# Patient Record
Sex: Female | Born: 1980
Health system: Southern US, Community
[De-identification: ages and names within clinical notes are randomized; demographics above are authoritative.]

## PROBLEM LIST (undated history)

## (undated) DIAGNOSIS — L509 Urticaria, unspecified: Secondary | ICD-10-CM

## (undated) DIAGNOSIS — E109 Type 1 diabetes mellitus without complications: Secondary | ICD-10-CM

## (undated) DIAGNOSIS — K580 Irritable bowel syndrome with diarrhea: Secondary | ICD-10-CM

## (undated) HISTORY — DX: Irritable bowel syndrome with diarrhea: K58.0

## (undated) HISTORY — PX: NO PAST SURGERIES: SHX2092

## (undated) HISTORY — DX: Type 1 diabetes mellitus without complications: E10.9

## (undated) HISTORY — DX: Urticaria, unspecified: L50.9

---

## 1999-08-02 ENCOUNTER — Other Ambulatory Visit: Admission: RE | Admit: 1999-08-02 | Discharge: 1999-08-02 | Payer: Self-pay

## 1999-08-02 ENCOUNTER — Encounter (INDEPENDENT_AMBULATORY_CARE_PROVIDER_SITE_OTHER): Payer: Self-pay | Admitting: Specialist

## 2000-11-23 ENCOUNTER — Encounter: Payer: Self-pay | Admitting: Emergency Medicine

## 2000-11-23 ENCOUNTER — Emergency Department (HOSPITAL_COMMUNITY): Admission: EM | Admit: 2000-11-23 | Discharge: 2000-11-23 | Payer: Self-pay | Admitting: Emergency Medicine

## 2001-12-29 ENCOUNTER — Emergency Department (HOSPITAL_COMMUNITY): Admission: EM | Admit: 2001-12-29 | Discharge: 2001-12-29 | Payer: Self-pay | Admitting: *Deleted

## 2002-10-22 ENCOUNTER — Other Ambulatory Visit: Admission: RE | Admit: 2002-10-22 | Discharge: 2002-10-22 | Payer: Self-pay | Admitting: Gynecology

## 2003-10-26 ENCOUNTER — Other Ambulatory Visit: Admission: RE | Admit: 2003-10-26 | Discharge: 2003-10-26 | Payer: Self-pay | Admitting: Gynecology

## 2004-11-04 ENCOUNTER — Other Ambulatory Visit: Admission: RE | Admit: 2004-11-04 | Discharge: 2004-11-04 | Payer: Self-pay | Admitting: Gynecology

## 2004-11-11 ENCOUNTER — Ambulatory Visit: Payer: Self-pay | Admitting: Family Medicine

## 2005-11-09 ENCOUNTER — Other Ambulatory Visit: Admission: RE | Admit: 2005-11-09 | Discharge: 2005-11-09 | Payer: Self-pay | Admitting: Gynecology

## 2006-11-14 ENCOUNTER — Other Ambulatory Visit: Admission: RE | Admit: 2006-11-14 | Discharge: 2006-11-14 | Payer: Self-pay | Admitting: Gynecology

## 2007-12-12 ENCOUNTER — Other Ambulatory Visit: Admission: RE | Admit: 2007-12-12 | Discharge: 2007-12-12 | Payer: Self-pay | Admitting: Gynecology

## 2008-12-30 ENCOUNTER — Encounter: Payer: Self-pay | Admitting: Women's Health

## 2008-12-30 ENCOUNTER — Ambulatory Visit: Payer: Self-pay | Admitting: Women's Health

## 2008-12-30 ENCOUNTER — Other Ambulatory Visit: Admission: RE | Admit: 2008-12-30 | Discharge: 2008-12-30 | Payer: Self-pay | Admitting: Gynecology

## 2015-11-24 DIAGNOSIS — Z794 Long term (current) use of insulin: Secondary | ICD-10-CM | POA: Insufficient documentation

## 2015-11-24 DIAGNOSIS — E109 Type 1 diabetes mellitus without complications: Secondary | ICD-10-CM | POA: Insufficient documentation

## 2017-10-19 DIAGNOSIS — E109 Type 1 diabetes mellitus without complications: Secondary | ICD-10-CM | POA: Diagnosis not present

## 2017-11-23 DIAGNOSIS — E109 Type 1 diabetes mellitus without complications: Secondary | ICD-10-CM | POA: Diagnosis not present

## 2017-12-18 DIAGNOSIS — E109 Type 1 diabetes mellitus without complications: Secondary | ICD-10-CM | POA: Diagnosis not present

## 2017-12-21 DIAGNOSIS — E109 Type 1 diabetes mellitus without complications: Secondary | ICD-10-CM | POA: Diagnosis not present

## 2017-12-21 DIAGNOSIS — Z794 Long term (current) use of insulin: Secondary | ICD-10-CM | POA: Diagnosis not present

## 2018-01-31 DIAGNOSIS — E109 Type 1 diabetes mellitus without complications: Secondary | ICD-10-CM | POA: Diagnosis not present

## 2018-02-28 DIAGNOSIS — E109 Type 1 diabetes mellitus without complications: Secondary | ICD-10-CM | POA: Diagnosis not present

## 2018-03-28 DIAGNOSIS — E109 Type 1 diabetes mellitus without complications: Secondary | ICD-10-CM | POA: Diagnosis not present

## 2018-04-23 DIAGNOSIS — Z9641 Presence of insulin pump (external) (internal): Secondary | ICD-10-CM | POA: Diagnosis not present

## 2018-04-23 DIAGNOSIS — E109 Type 1 diabetes mellitus without complications: Secondary | ICD-10-CM | POA: Diagnosis not present

## 2018-04-29 DIAGNOSIS — E109 Type 1 diabetes mellitus without complications: Secondary | ICD-10-CM | POA: Diagnosis not present

## 2018-05-29 DIAGNOSIS — E109 Type 1 diabetes mellitus without complications: Secondary | ICD-10-CM | POA: Diagnosis not present

## 2018-06-29 DIAGNOSIS — E109 Type 1 diabetes mellitus without complications: Secondary | ICD-10-CM | POA: Diagnosis not present

## 2018-07-29 DIAGNOSIS — Z01419 Encounter for gynecological examination (general) (routine) without abnormal findings: Secondary | ICD-10-CM | POA: Diagnosis not present

## 2018-07-29 DIAGNOSIS — E109 Type 1 diabetes mellitus without complications: Secondary | ICD-10-CM | POA: Diagnosis not present

## 2018-07-29 DIAGNOSIS — Z0000 Encounter for general adult medical examination without abnormal findings: Secondary | ICD-10-CM | POA: Diagnosis not present

## 2018-07-29 DIAGNOSIS — Z6823 Body mass index (BMI) 23.0-23.9, adult: Secondary | ICD-10-CM | POA: Diagnosis not present

## 2018-07-29 LAB — HM PAP SMEAR: HM Pap smear: NORMAL

## 2018-08-02 DIAGNOSIS — M25552 Pain in left hip: Secondary | ICD-10-CM | POA: Diagnosis not present

## 2018-08-02 DIAGNOSIS — M25551 Pain in right hip: Secondary | ICD-10-CM | POA: Diagnosis not present

## 2018-08-09 DIAGNOSIS — T7840XA Allergy, unspecified, initial encounter: Secondary | ICD-10-CM | POA: Diagnosis not present

## 2018-08-09 DIAGNOSIS — L2389 Allergic contact dermatitis due to other agents: Secondary | ICD-10-CM | POA: Diagnosis not present

## 2018-08-16 DIAGNOSIS — R531 Weakness: Secondary | ICD-10-CM | POA: Diagnosis not present

## 2018-08-16 DIAGNOSIS — M25651 Stiffness of right hip, not elsewhere classified: Secondary | ICD-10-CM | POA: Diagnosis not present

## 2018-08-16 DIAGNOSIS — M25551 Pain in right hip: Secondary | ICD-10-CM | POA: Diagnosis not present

## 2018-08-16 DIAGNOSIS — M25552 Pain in left hip: Secondary | ICD-10-CM | POA: Diagnosis not present

## 2018-08-20 DIAGNOSIS — M25551 Pain in right hip: Secondary | ICD-10-CM | POA: Diagnosis not present

## 2018-08-20 DIAGNOSIS — R531 Weakness: Secondary | ICD-10-CM | POA: Diagnosis not present

## 2018-08-20 DIAGNOSIS — M25552 Pain in left hip: Secondary | ICD-10-CM | POA: Diagnosis not present

## 2018-08-20 DIAGNOSIS — M25651 Stiffness of right hip, not elsewhere classified: Secondary | ICD-10-CM | POA: Diagnosis not present

## 2018-08-22 DIAGNOSIS — M25552 Pain in left hip: Secondary | ICD-10-CM | POA: Diagnosis not present

## 2018-08-22 DIAGNOSIS — R531 Weakness: Secondary | ICD-10-CM | POA: Diagnosis not present

## 2018-08-22 DIAGNOSIS — M25551 Pain in right hip: Secondary | ICD-10-CM | POA: Diagnosis not present

## 2018-08-22 DIAGNOSIS — M25651 Stiffness of right hip, not elsewhere classified: Secondary | ICD-10-CM | POA: Diagnosis not present

## 2018-08-27 DIAGNOSIS — M25651 Stiffness of right hip, not elsewhere classified: Secondary | ICD-10-CM | POA: Diagnosis not present

## 2018-08-27 DIAGNOSIS — M25552 Pain in left hip: Secondary | ICD-10-CM | POA: Diagnosis not present

## 2018-08-27 DIAGNOSIS — M25551 Pain in right hip: Secondary | ICD-10-CM | POA: Diagnosis not present

## 2018-08-27 DIAGNOSIS — R531 Weakness: Secondary | ICD-10-CM | POA: Diagnosis not present

## 2018-08-28 DIAGNOSIS — E109 Type 1 diabetes mellitus without complications: Secondary | ICD-10-CM | POA: Diagnosis not present

## 2018-08-29 DIAGNOSIS — R531 Weakness: Secondary | ICD-10-CM | POA: Diagnosis not present

## 2018-08-29 DIAGNOSIS — M25651 Stiffness of right hip, not elsewhere classified: Secondary | ICD-10-CM | POA: Diagnosis not present

## 2018-08-29 DIAGNOSIS — M25551 Pain in right hip: Secondary | ICD-10-CM | POA: Diagnosis not present

## 2018-08-29 DIAGNOSIS — M25552 Pain in left hip: Secondary | ICD-10-CM | POA: Diagnosis not present

## 2018-08-30 DIAGNOSIS — E109 Type 1 diabetes mellitus without complications: Secondary | ICD-10-CM | POA: Diagnosis not present

## 2018-09-04 DIAGNOSIS — M25651 Stiffness of right hip, not elsewhere classified: Secondary | ICD-10-CM | POA: Diagnosis not present

## 2018-09-04 DIAGNOSIS — M25551 Pain in right hip: Secondary | ICD-10-CM | POA: Diagnosis not present

## 2018-09-04 DIAGNOSIS — M25552 Pain in left hip: Secondary | ICD-10-CM | POA: Diagnosis not present

## 2018-09-04 DIAGNOSIS — R531 Weakness: Secondary | ICD-10-CM | POA: Diagnosis not present

## 2018-09-11 DIAGNOSIS — M25651 Stiffness of right hip, not elsewhere classified: Secondary | ICD-10-CM | POA: Diagnosis not present

## 2018-09-11 DIAGNOSIS — M25551 Pain in right hip: Secondary | ICD-10-CM | POA: Diagnosis not present

## 2018-09-11 DIAGNOSIS — R531 Weakness: Secondary | ICD-10-CM | POA: Diagnosis not present

## 2018-09-11 DIAGNOSIS — M25552 Pain in left hip: Secondary | ICD-10-CM | POA: Diagnosis not present

## 2018-09-18 DIAGNOSIS — M25551 Pain in right hip: Secondary | ICD-10-CM | POA: Diagnosis not present

## 2018-09-18 DIAGNOSIS — R531 Weakness: Secondary | ICD-10-CM | POA: Diagnosis not present

## 2018-09-18 DIAGNOSIS — M25552 Pain in left hip: Secondary | ICD-10-CM | POA: Diagnosis not present

## 2018-09-18 DIAGNOSIS — M25651 Stiffness of right hip, not elsewhere classified: Secondary | ICD-10-CM | POA: Diagnosis not present

## 2018-09-25 DIAGNOSIS — M25651 Stiffness of right hip, not elsewhere classified: Secondary | ICD-10-CM | POA: Diagnosis not present

## 2018-09-25 DIAGNOSIS — M25551 Pain in right hip: Secondary | ICD-10-CM | POA: Diagnosis not present

## 2018-09-25 DIAGNOSIS — M25552 Pain in left hip: Secondary | ICD-10-CM | POA: Diagnosis not present

## 2018-09-25 DIAGNOSIS — R531 Weakness: Secondary | ICD-10-CM | POA: Diagnosis not present

## 2018-09-28 DIAGNOSIS — E109 Type 1 diabetes mellitus without complications: Secondary | ICD-10-CM | POA: Diagnosis not present

## 2018-10-04 DIAGNOSIS — M25651 Stiffness of right hip, not elsewhere classified: Secondary | ICD-10-CM | POA: Diagnosis not present

## 2018-10-04 DIAGNOSIS — M25552 Pain in left hip: Secondary | ICD-10-CM | POA: Diagnosis not present

## 2018-10-04 DIAGNOSIS — R531 Weakness: Secondary | ICD-10-CM | POA: Diagnosis not present

## 2018-10-04 DIAGNOSIS — M25551 Pain in right hip: Secondary | ICD-10-CM | POA: Diagnosis not present

## 2018-11-05 DIAGNOSIS — E109 Type 1 diabetes mellitus without complications: Secondary | ICD-10-CM | POA: Diagnosis not present

## 2018-12-02 DIAGNOSIS — E109 Type 1 diabetes mellitus without complications: Secondary | ICD-10-CM | POA: Diagnosis not present

## 2018-12-26 DIAGNOSIS — E109 Type 1 diabetes mellitus without complications: Secondary | ICD-10-CM | POA: Diagnosis not present

## 2018-12-31 DIAGNOSIS — Z9641 Presence of insulin pump (external) (internal): Secondary | ICD-10-CM | POA: Diagnosis not present

## 2018-12-31 DIAGNOSIS — E109 Type 1 diabetes mellitus without complications: Secondary | ICD-10-CM | POA: Diagnosis not present

## 2019-02-07 LAB — HM DIABETES EYE EXAM

## 2019-03-27 DIAGNOSIS — E109 Type 1 diabetes mellitus without complications: Secondary | ICD-10-CM | POA: Diagnosis not present

## 2019-05-02 DIAGNOSIS — E109 Type 1 diabetes mellitus without complications: Secondary | ICD-10-CM | POA: Diagnosis not present

## 2019-05-02 DIAGNOSIS — Z794 Long term (current) use of insulin: Secondary | ICD-10-CM | POA: Diagnosis not present

## 2019-05-07 DIAGNOSIS — E109 Type 1 diabetes mellitus without complications: Secondary | ICD-10-CM | POA: Diagnosis not present

## 2019-06-27 DIAGNOSIS — E109 Type 1 diabetes mellitus without complications: Secondary | ICD-10-CM | POA: Diagnosis not present

## 2019-08-26 DIAGNOSIS — Z6823 Body mass index (BMI) 23.0-23.9, adult: Secondary | ICD-10-CM | POA: Diagnosis not present

## 2019-08-26 DIAGNOSIS — Z23 Encounter for immunization: Secondary | ICD-10-CM | POA: Diagnosis not present

## 2019-08-26 DIAGNOSIS — Z0000 Encounter for general adult medical examination without abnormal findings: Secondary | ICD-10-CM | POA: Diagnosis not present

## 2019-08-29 DIAGNOSIS — E109 Type 1 diabetes mellitus without complications: Secondary | ICD-10-CM | POA: Diagnosis not present

## 2019-08-29 DIAGNOSIS — Z794 Long term (current) use of insulin: Secondary | ICD-10-CM | POA: Diagnosis not present

## 2019-09-26 DIAGNOSIS — E109 Type 1 diabetes mellitus without complications: Secondary | ICD-10-CM | POA: Diagnosis not present

## 2019-12-16 DIAGNOSIS — E109 Type 1 diabetes mellitus without complications: Secondary | ICD-10-CM | POA: Diagnosis not present

## 2019-12-30 ENCOUNTER — Ambulatory Visit: Payer: Self-pay | Admitting: Nurse Practitioner

## 2019-12-30 ENCOUNTER — Other Ambulatory Visit: Payer: Self-pay

## 2019-12-30 ENCOUNTER — Encounter: Payer: Self-pay | Admitting: Nurse Practitioner

## 2019-12-30 ENCOUNTER — Ambulatory Visit (INDEPENDENT_AMBULATORY_CARE_PROVIDER_SITE_OTHER): Payer: BC Managed Care – PPO | Admitting: Nurse Practitioner

## 2019-12-30 VITALS — BP 102/70 | HR 73 | Temp 98.4°F | Resp 17 | Ht 67.0 in | Wt 154.2 lb

## 2019-12-30 DIAGNOSIS — L509 Urticaria, unspecified: Secondary | ICD-10-CM

## 2019-12-30 DIAGNOSIS — Z794 Long term (current) use of insulin: Secondary | ICD-10-CM

## 2019-12-30 DIAGNOSIS — L508 Other urticaria: Secondary | ICD-10-CM | POA: Insufficient documentation

## 2019-12-30 MED ORDER — PREDNISONE 10 MG PO TABS: ORAL_TABLET | ORAL | 0 refills | Status: DC

## 2019-12-30 MED ORDER — TRIAMCINOLONE ACETONIDE 40 MG/ML IJ SUSP
60.0000 mg | Freq: Once | INTRAMUSCULAR | Status: AC
  Administered 2019-12-30: 60 mg via INTRAMUSCULAR

## 2019-12-30 NOTE — Patient Instructions (Addendum)
Hives Hives are itchy, red, swollen areas on your skin. Hives can show up on any part of your body. Hives often fade within 24 hours (acute hives). New hives can show up after old ones fade. This can go on for many days or weeks (chronic hives). Hives do not spread from person to person (are not contagious). Hives are caused by your body's response to something that you are allergic to (allergen). These are sometimes called triggers. You can get hives right after being around a trigger, or hours later. What are the causes?  Allergies to foods.  Insect bites or stings.  Pollen.  Pets.  Latex.  Chemicals.  Spending time in sunlight, heat, or cold.  Exercise.  Stress.  Some medicines.  Viruses. This includes the common cold.  Infections caused by germs (bacteria).  Allergy shots.  Blood transfusions. Sometimes, the cause is not known. What increases the risk?  Being a woman.  Being allergic to foods such as: ? Citrus fruits. ? Milk. ? Eggs. ? Peanuts. ? Tree nuts. ? Shellfish.  Being allergic to: ? Medicines. ? Latex. ? Insects. ? Animals. ? Pollen. What are the signs or symptoms?   Raised, itchy, red or white bumps or patches on your skin. These areas may: ? Get large and swollen. ? Change in shape and location. ? Stand alone or connect to each other over a large area of skin. ? Sting or hurt. ? Turn white when pressed in the center (blanch). In very bad cases, your hands, feet, and face may also get swollen. This may happen if hives start deeper in your skin. How is this treated? Treatment for this condition depends on your symptoms. Treatment may include:  Using cool, wet cloths (cool compresses) or taking cool showers to stop the itching.  Medicines that help: ? Relieve itching (antihistamines). ? Reduce swelling (corticosteroids). ? Treat infection (antibiotics).  A medicine (omalizumab) that is given as a shot (injection). Your doctor may  prescribe this if you have hives that do not get better even after other treatments.  In very bad cases, you may need a shot of a medicine called epinephrineto prevent a life-threatening allergic reaction (anaphylaxis). Follow these instructions at home: Medicines  Take or apply over-the-counter and prescription medicines only as told by your doctor.  If you were prescribed an antibiotic medicine, use it as told by your doctor. Do not stop using it even if you start to feel better. Skin care  Apply cool, wet cloths to the hives.  Do not scratch your skin. Do not rub your skin. General instructions  Do not take hot showers or baths. This can make itching worse.  Do not wear tight clothes.  Use sunscreen and wear clothes that cover your skin when you are outside.  Avoid any triggers that cause your hives. Keep a journal to help track what causes your hives. Write down: ? What medicines you take. ? What you eat and drink. ? What products you use on your skin.  Keep all follow-up visits as told by your doctor. This is important. Contact a doctor if:  Your symptoms are not better with medicine.  Your joints hurt or are swollen. Get help right away if:  You have a fever.  You have pain in your belly (abdomen).  Your tongue or lips are swollen.  Your eyelids are swollen.  Your chest or throat feels tight.  You have trouble breathing or swallowing. These symptoms may be an emergency.   Do not wait to see if the symptoms will go away. Get medical help right away. Call your local emergency services (911 in the [place] Do not drive yourself to the hospital. Summary  Hives are itchy, red, swollen areas on your skin.  Treatment for this condition depends on your symptoms.  Avoid things that cause your hives. Keep a journal to help track what causes your hives.  Take and apply over-the-counter and prescription medicines only as told by your doctor.  Keep all follow-up visits  as told by your doctor. This is important. This information is not intended to replace advice given to you by your health care provider. Make sure you discuss any questions you have with your health care provider. Document Revised: 04/10/2018 Document Reviewed: 04/10/2018 Elsevier Patient Education  2020 [employer] Inc.  

## 2019-12-30 NOTE — Assessment & Plan Note (Addendum)
Not well controlled.  Steroid shot given in office, Rx sent to pharmacy, Asthma and Allergy clinic referral done. Patient knows to follow up as needed. Marland Kitchen

## 2019-12-30 NOTE — Assessment & Plan Note (Signed)
Well controlled.  No changes to medicines.  Continue to work on eating a healthy diet and exercise.  No Labs drawn today.  

## 2019-12-30 NOTE — Progress Notes (Signed)
Acute Office Visit  Subjective:    Patient ID: Susan Chapman, female    DOB: January 31, 1981, 39 y.o.   MRN: [id number]  Chief Complaint  Patient presents with  . Rash    Since 3 weeks ago   Patient  is a 39 year old female. She  is here for Hives.  The patient's medications were reviewed and reconciled since the patient's last visit.  History details were provided by the patient. The history appears to be reliable.     HPI Patient is in today for Hives which are generally idiopathic; possibly related to foods, sun, heat, cold or viruses. Patient reports hives have been present for 3 weeks off and on. Nothing aggravates it. Patient has not changed laundry detergent or any new cosmetics. benadryl at night has helped symptoms.   Past Medical History:  Diagnosis Date  . Irritable bowel syndrome with diarrhea   . Type 1 diabetes mellitus without complications ([place])     History reviewed. No pertinent surgical history.  Family History  Problem Relation Age of Onset  . Diabetes type I Mother     Social History   Socioeconomic History  . Marital status: Single    Spouse name: Not on file  . Number of children: Not on file  . Years of education: Not on file  . Highest education level: Not on file  Occupational History  . 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 Smoking status: Former Smoker    Types: Cigarettes    Quit date: 2017    Years since quitting: 4.2  . Smokeless tobacco: Never Used  Substance and Sexual Activity  . Alcohol use: Yes    Alcohol/week: 1.0 standard drinks    Types: 1 Shots of liquor per week    Comment: rarely  . Drug use: Never  . Sexual activity: Not Currently  Other Topics Concern  . Not on file  Social History Narrative  . Not on file   Social Determinants of Health   Financial Resource Strain:   . Difficulty of Paying Living Expenses:   Food Insecurity:   . Worried About Charity fundraiser in the Last Year:   . Arboriculturist in  the Last Year:   Transportation Needs:   . Film/video editor (Medical):   Marland Kitchen Lack of Transportation (Non-Medical):   Physical Activity:   . Days of Exercise per Week:   . Minutes of Exercise per Session:   Stress:   . Feeling of Stress :   Social Connections:   . Frequency of Communication with Friends and Family:   . Frequency of Social Gatherings with Friends and Family:   . Attends Religious Services:   . Active Member of Clubs or Organizations:   . Attends Archivist Meetings:   Marland Kitchen Marital Status:   Intimate Partner Violence:   . Fear of Current or Ex-Partner:   . Emotionally Abused:   Marland Kitchen Physically Abused:   . Sexually Abused:     Outpatient Medications Prior to Visit  Medication Sig Dispense Refill  . CONTOUR NEXT TEST test strip CHECK SUGAR 8 TIMES DAILY    . dicyclomine (BENTYL) 10 MG capsule Take 10 mg by mouth 3 (three) times daily.    . Insulin Disposable Pump (OMNIPOD DASH 5 PACK PODS) MISC USE TO ADMINISTER INSULIN. CHANGE EVERY 72 HOURS    . levonorgestrel (MIRENA) 20 MCG/24HR IUD 1 each by Intrauterine route once.  No facility-administered medications prior to visit.    No Known Allergies  Review of Systems  Constitutional: Negative for activity change, appetite change and fatigue.  HENT: Negative for ear pain and facial swelling.   Eyes: Negative for pain and itching.  Respiratory: Negative for chest tightness and shortness of breath.   Cardiovascular: Negative for chest pain and palpitations.  Gastrointestinal: Negative for abdominal pain and constipation.  Genitourinary: Negative for difficulty urinating.  Musculoskeletal: Negative for arthralgias and myalgias.  Skin: Positive for color change and rash.  Allergic/Immunologic:       Hives present  Neurological: Negative for headaches.       Objective:    Physical Exam Constitutional:      Appearance: Normal appearance.  HENT:     Head: Normocephalic.     Right Ear: External ear  normal.     Left Ear: External ear normal.     Nose: Nose normal. No congestion.     Mouth/Throat:     Mouth: Mucous membranes are moist.  Eyes:     Conjunctiva/sclera: Conjunctivae normal.  Cardiovascular:     Rate and Rhythm: Normal rate and regular rhythm.     Pulses: Normal pulses.     Heart sounds: Normal heart sounds.  Pulmonary:     Effort: Pulmonary effort is normal.     Breath sounds: Normal breath sounds.  Abdominal:     General: Bowel sounds are normal.     Palpations: Abdomen is soft.  Musculoskeletal:     Cervical back: Normal range of motion.  Skin:    General: Skin is warm.     Findings: Rash present.  Neurological:     Mental Status: She is alert.  Psychiatric:        Judgment: Judgment normal.     BP 102/70 (BP Location: Right Arm, Patient Position: Sitting)   Pulse 73   Temp 98.4 F (36.9 C) (Temporal)   Resp 17   Ht 5' 7" (1.702 m)   Wt 154 lb 3.2 oz (69.9 kg)   SpO2 98%   BMI 24.15 kg/m  Wt Readings from Last 3 Encounters:  12/30/19 154 lb 3.2 oz (69.9 kg)    Health Maintenance Due  Topic Date Due  . HEMOGLOBIN A1C  Never done  . PNEUMOCOCCAL POLYSACCHARIDE VACCINE AGE 62-64 HIGH RISK  Never done  . FOOT EXAM  Never done  . URINE MICROALBUMIN  Never done  . HIV Screening  Never done  . TETANUS/TDAP  Never done    There are no preventive care reminders to display for this patient.   No results found for: TSH No results found for: WBC, HGB, HCT, MCV, PLT No results found for: NA, K, CHLORIDE, CO2, GLUCOSE, BUN, CREATININE, BILITOT, ALKPHOS, AST, ALT, PROT, ALBUMIN, CALCIUM, ANIONGAP, EGFR, GFR No results found for: CHOL No results found for: HDL No results found for: LDLCALC No results found for: TRIG No results found for: CHOLHDL No results found for: HGBA1C     Assessment & Plan:  Insulin long-term use ([place]) Well controlled.  No changes to medicines.  Continue to work on eating a healthy diet and exercise.  No Labs drawn  today.   Hives Not well controlled.  Steroid shot given in office, Rx sent to pharmacy, Asthma and Allergy clinic referral done. Patient knows to follow up as needed. .    Problem List Items Addressed This Visit      Musculoskeletal and Integument   Hives - Primary  Not well controlled.  Steroid shot given in office, Rx sent to pharmacy, Asthma and Allergy clinic referral done. Patient knows to follow up as needed. .        Relevant Medications   predniSONE (DELTASONE) 10 MG tablet   Other Relevant Orders   Ambulatory referral to Allergy     Other   Insulin long-term use ([place])    Well controlled.  No changes to medicines.  Continue to work on eating a healthy diet and exercise.  No Labs drawn today.           Meds ordered this encounter  Medications  . predniSONE (DELTASONE) 10 MG tablet    Sig: 6 tabs day 1, 5 tab day 2, 4 tab day 3, 3 day 4, 2 day 5, 1 day 6    Dispense:  21 tablet    Refill:  0    Order Specific Question:   Supervising Provider    AnswerShelton Silvas  . triamcinolone acetonide (KENALOG-40) injection 60 mg     Ivy Lynn, NP

## 2020-01-20 ENCOUNTER — Other Ambulatory Visit: Payer: Self-pay

## 2020-01-20 ENCOUNTER — Ambulatory Visit (INDEPENDENT_AMBULATORY_CARE_PROVIDER_SITE_OTHER): Payer: BC Managed Care – PPO | Admitting: Allergy

## 2020-01-20 ENCOUNTER — Encounter: Payer: Self-pay | Admitting: Allergy

## 2020-01-20 VITALS — BP 126/86 | HR 71 | Temp 97.7°F | Resp 16 | Ht 67.6 in | Wt 153.0 lb

## 2020-01-20 DIAGNOSIS — L508 Other urticaria: Secondary | ICD-10-CM | POA: Diagnosis not present

## 2020-01-20 DIAGNOSIS — T783XXD Angioneurotic edema, subsequent encounter: Secondary | ICD-10-CM | POA: Diagnosis not present

## 2020-01-20 NOTE — Patient Instructions (Signed)
Hives and swelling, chronic  - at this time etiology of hives and swelling is unknown.  Hives can be caused by a variety of different triggers including illness/infection, foods, medications, stings, exercise, pressure, vibrations, extremes of temperature to name a few however majority of the time there is no identifiable trigger.  Your symptoms have been ongoing for >6 weeks making this chronic thus will obtain labwork to evaluate: tryptase, hive panel, environmental panel, alpha-gal panel, inflammatory markers  - CBC, CMP and TSH levels are normal from March 2021  - for management of hives recommend the following regimen: Allegra 180mg , Zyrtec 10mg  or Xyzal 5mg   1 tab twice a day with Pepcid 20mg  1 tab twice a day  - if hives continue let know then would add Singulair to the regimen  - if hives continue despite the above then Xolair injectable monthly medication will be discussed further for management  Follow-up 2-3 months or sooner if needed   **We are ordering labs, so please allow 1-2 weeks for the results to come back.  With the newly implemented Cures Act, the labs might be visible to you at the same time that they become visible to me.  However, I will not address the results until all of the results come  back, so please be patient.  In the meantime, continue avoiding your triggering food(s) in your After Visit Summary, including avoidance measures (if applicable), until you hear from me about the results.

## 2020-01-20 NOTE — Progress Notes (Signed)
New Patient Note  RE: Susan Chapman MRN: [id number] DOB: Nov 23, 1980 Date of Office Visit: 01/20/2020  Referring provider: Daryll Drown, NP Primary care provider: Daryll Drown, NP  Chief Complaint: hives  History of present illness: Susan Chapman is a 39 y.o. female presenting today for consultation for hives.                                                                                                                                  She went to [place] around the end of February 2021 and when she returned she states she had a lot of "bites" that she thought was insect bites.  However the rash turned into welts and was continuing on and off.  The welts would come on mostly at night in the beginning.  She would take benadryl to help with sleep and the welts would be gone in the morning.  However the rash then started to occur during the day as well.  Initially she states the welts were more so along her bra and pant line.  She states she would note the welts after she would take off her clothes.  She rides horses many days a week.  She thought maybe it was her detergent causing the welts.  However the welts kept coming and started to be more wide spread.  2 weeks ago she noted a spot on her lip that felt tingly and the noted the lip on the right side was swollen and she also woke up with right eye swelling.  She took benadryl which didn't help the swelling but she woke up the next day and the swelling was gone.  She did provide pictures of the rash which is consistent with urticaria as well as angioedema of the lip.  No joint pains/aches, no fever.  No change in detergents/lotions/soaps.  She does have an area on her abdomen where the hive still appears a faint red spot.   She also states she has an area above her buttocks that gets a bit tender.  She has a history shingles and states she has had 4 episodes of shingles.  She initially thought the rash could've been shingles but  then noted it was not acting how her shingles normally does and when it become more widespread with different appearance knew it wasn't shingles.  She saw her PCP for this and was given steroid shot and prednisone for 6 days and completed about 2 weeks ago.  She states it "shot up" her blood sugar and took about 4-5 days before she noted any improvements.  She does however note that the welts have been less since the steroids.  She states several years ago she had an episode where her skin got "bright red" and hot and itchy.  [facility] and symptoms resolved on its own.  She states this happened again randomly last occurred about a year ago.   Denies history of asthma, eczema, food allergy or environmental allergy.                                                                                                                                                                                                                                        Review of systems: Review of Systems  Constitutional: Negative.   HENT: Negative.   Eyes: Negative.   Respiratory: Negative.   Cardiovascular: Negative.   Gastrointestinal: Negative.   Musculoskeletal: Negative.   Skin:       See HPI  Neurological: Negative.     All other systems negative unless noted above in HPI  Past medical history: Past Medical History:  Diagnosis Date  . Irritable bowel syndrome with diarrhea   . Type 1 diabetes mellitus without complications [facility])     Past surgical history: History reviewed. No pertinent surgical history.  Family history:  Family History  Problem Relation Age of Onset  . Diabetes type I Mother   . High blood pressure Mother   . Cancer Maternal Grandmother     Social history: Lives in a home with carpeting in the bedroom with heat pump.  Cats in the home.   No concern for water damage, mildew or roaches in the home.  She is an Optometrist.  Denies smoking history.    Medication List: Current Outpatient Medications  Medication Sig Dispense Refill  . dicyclomine (BENTYL) 10 MG capsule Take 10 mg by mouth 3 (three) times daily.    . diphenhydrAMINE HCl (BENADRYL ALLERGY PO) Take by mouth.    . Insulin Disposable Pump (OMNIPOD DASH 5 PACK PODS) MISC USE TO ADMINISTER INSULIN. CHANGE EVERY 72 HOURS    . Multiple Vitamin (MULTIVITAMIN) tablet Take 1 tablet by mouth daily.    . Nutritional Supplements (NUTRITIONAL SUPPLEMENT PO) Take by mouth. Luminex Activate Replenex     No current facility-administered medications for this visit.    Known medication allergies: No Known Allergies   Physical examination: Blood pressure 126/86, pulse 71, temperature 97.7 F (36.5 C), temperature source Temporal, resp. rate 16, height 5' 7.6" (1.717 m),  weight 153 lb (69.4 kg), SpO2 100 %.  General: Alert, interactive, in no acute distress. HEENT: PERRLA, TMs pearly gray, turbinates non-edematous without discharge, post-pharynx non erythematous. Neck: Supple without lymphadenopathy. Lungs: Clear to auscultation without wheezing, rhonchi or rales. {no increased work of breathing. CV: Normal S1, S2 without murmurs. Abdomen: Nondistended, nontender. Skin: 3 round erythmatous papule with overlying scale above intergluteal cleft. Extremities:  No clubbing, cyanosis or edema. Neuro:   Grossly intact.  Diagnositics/Labs: Labs:  From 12/16/2019 Component Value Ref Range  TSH 2.915 0.450 - 5.330 UIU/ML    Component Value Ref Range  Sodium 138 135 - 146 MMOL/L  Potassium 4.0 3.5 - 5.3 MMOL/L  Chloride 104 98 - 110 MMOL/L  CO2 27 23 - 30 MMOL/L  BUN 11 8 - 24 MG/DL  Glucose [id number]: Patients taking eltrombopag at doses >/= 100 mg daily may show falsely elevated values of 10% or greater. 70 - 99 MG/DL  Creatinine 2.68 3.41 - 1.50 MG/DL  Calcium 9.4 8.5 -  96.2 MG/DL  Total Protein 2.2LNLGXQJ: Patients taking eltrombopag at doses >/= 100 mg daily may show falsely elevated values of 10% or greater. 6.0 - 8.3 G/DL  Albumin  4.3 3.5 - 5.0 G/DL  Total Bilirubin 1.9ERDEYCX: Patients taking eltrombopag at doses >/= 100 mg daily may show falsely elevated values of 10% or greater. 0.1 - 1.2 MG/DL  Alkaline Phosphatase 36 25 - 125 IU/L or U/L  AST (SGOT) 14 5 - 40 IU/L or U/L  ALT (SGPT) 12 5 - 50 IU/L or U/L  Anion Gap 7 4 - 14 MMOL/L  Est. GFR Non-African American >=90Comment: GFR estimated by CKD-EPI equations, reportable up to 90 ML/MIN/1.73 M*2 >=60 ML/MIN/1.73 M*2    WBC 6.2 4.4 - 11.0 x 10*3/uL  RBC 4.20 4.10 - 5.10 x 10*6/uL  Hemoglobin 12.7 12.3 - 15.3 G/DL  Hematocrit 44.8 18.5 - 44.6 %  MCV 90.6 80.0 - 96.0 FL  MCH 30.3 27.5 - 33.2 PG  MCHC 33.4 33.0 - 37.0 G/DL  RDW 63.1 49.7 - 02.6 %  Platelets 346 150 - 450 X 10*3/uL  MPV 7.5 6.8 - 10.2 FL    Assessment and plan:   Urticaria and angioedema, chronic  - at this time etiology of hives and swelling is unknown.  Hives can be caused by a variety of different triggers including illness/infection, foods, medications, stings, exercise, pressure, vibrations, extremes of temperature to name a few however majority of the time there is no identifiable trigger.  Your symptoms have been ongoing for >6 weeks making this chronic thus will obtain labwork to evaluate: tryptase, hive panel, environmental panel, alpha-gal panel, inflammatory markers  - CBC, CMP and TSH levels are normal from March 2021  - for management of hives recommend the following regimen: Allegra 180mg , Zyrtec 10mg  or Xyzal 5mg   1 tab twice a day with Pepcid 20mg  1 tab twice a day  - if hives continue let know then would add Singulair to the regimen  - if hives continue despite the above then Xolair injectable monthly medication will be discussed further for management  Follow-up 2-3 months or sooner if needed  I appreciate  the opportunity to take part in Tristina's care. Please do not hesitate to contact me with questions.  Sincerely,   , MD Allergy/Immunology [facility]

## 2020-01-28 LAB — SEDIMENTATION RATE: Sed Rate: 9 mm/hr (ref 0–32)

## 2020-01-28 LAB — ALLERGENS W/TOTAL IGE AREA 2
Alternaria Alternata IgE: <0.1 kU/L
Aspergillus Fumigatus IgE: <0.1 kU/L
Bermuda Grass IgE: 0.26 kU/L — AB
Cat Dander IgE: <0.1 kU/L
Cedar, Mountain IgE: 0.22 kU/L — AB
Cladosporium Herbarum IgE: <0.1 kU/L
Cockroach, German IgE: 0.24 kU/L — AB
Common Silver Birch IgE: 0.21 kU/L — AB
Cottonwood IgE: 0.29 kU/L — AB
D Farinae IgE: 0.13 kU/L — AB
D Pteronyssinus IgE: 0.1 kU/L — AB
Dog Dander IgE: <0.1 kU/L
Elm, American IgE: 0.32 kU/L — AB
IgE (Immunoglobulin E), Serum: 44 IU/mL (ref 6–495)
Johnson Grass IgE: 0.28 kU/L — AB
Maple/Box Elder IgE: 0.27 kU/L — AB
Mouse Urine IgE: <0.1 kU/L
Oak, White IgE: 0.3 kU/L — AB
Pecan, Hickory IgE: 0.29 kU/L — AB
Penicillium Chrysogen IgE: <0.1 kU/L
Pigweed, Rough IgE: 0.26 kU/L — AB
Ragweed, Short IgE: 0.29 kU/L — AB
Sheep Sorrel IgE Qn: 0.27 kU/L — AB
Timothy Grass IgE: 0.25 kU/L — AB
White Mulberry IgE: 0.19 kU/L — AB

## 2020-01-28 LAB — CHRONIC URTICARIA: cu index: 39.3 — ABNORMAL HIGH (ref <10)

## 2020-01-28 LAB — THYROID ANTIBODIES
Thyroglobulin Antibody: <1 IU/mL (ref 0.0–0.9)
Thyroperoxidase Ab SerPl-aCnc: 151 IU/mL — ABNORMAL HIGH (ref 0–34)

## 2020-01-28 LAB — ALLERGEN, HORSE DANDER,E3: Horse dander: 0.66 kU/L — AB

## 2020-01-28 LAB — ANA W/REFLEX: Anti Nuclear Antibody (ANA): NEGATIVE

## 2020-01-28 LAB — TRYPTASE: Tryptase: 6.8 ug/L (ref 2.2–13.2)

## 2020-02-17 DIAGNOSIS — E109 Type 1 diabetes mellitus without complications: Secondary | ICD-10-CM | POA: Diagnosis not present

## 2020-03-09 ENCOUNTER — Other Ambulatory Visit: Payer: Self-pay

## 2020-03-09 ENCOUNTER — Ambulatory Visit: Payer: BC Managed Care – PPO | Admitting: Allergy

## 2020-03-09 ENCOUNTER — Encounter: Payer: Self-pay | Admitting: Allergy

## 2020-03-09 VITALS — BP 110/80 | HR 68 | Resp 16

## 2020-03-09 DIAGNOSIS — T783XXD Angioneurotic edema, subsequent encounter: Secondary | ICD-10-CM | POA: Diagnosis not present

## 2020-03-09 DIAGNOSIS — L508 Other urticaria: Secondary | ICD-10-CM | POA: Diagnosis not present

## 2020-03-09 NOTE — Progress Notes (Signed)
Follow-up Note  RE: Susan Chapman MRN: [id number] DOB: 1980-11-10 Date of Office Visit: 03/09/2020   History of present illness: Susan Chapman is a 39 y.o. female presenting today for follow-up of chronic urticaria. She was last seen in the office on 01/20/20 by myself.  She states since her initial visits she has not had any major episodes of hives like prior to her visit.  She states she has had some hives here and there which were manageable.  She states taking Susan Chapman is working well for her hives.  Has not needed to add in Pepcid.  Otherwise has been doing well.   Review of systems: Review of Systems  Constitutional: Negative.   HENT: Negative.   Eyes: Negative.   Respiratory: Negative.   Cardiovascular: Negative.   Gastrointestinal: Negative.   Musculoskeletal: Negative.   Skin:       See HPI  Neurological: Negative.     All other systems negative unless noted above in HPI  Past medical/social/surgical/family history have been reviewed and are unchanged unless specifically indicated below.  No changes  Medication List: Current Outpatient Medications  Medication Sig Dispense Refill  . dicyclomine (BENTYL) 10 MG capsule Take 10 mg by mouth 3 (three) times daily.    . diphenhydrAMINE HCl (BENADRYL ALLERGY PO) Take by mouth.    . fexofenadine (ALLEGRA) 180 MG tablet Take 180 mg by mouth daily.    . Insulin Disposable Pump (OMNIPOD DASH 5 PACK PODS) MISC USE TO ADMINISTER INSULIN. CHANGE EVERY 72 HOURS    . Multiple Vitamin (MULTIVITAMIN) tablet Take 1 tablet by mouth daily.    . Nutritional Supplements (NUTRITIONAL SUPPLEMENT PO) Take by mouth. Luminex Activate Replenex     No current facility-administered medications for this visit.     Known medication allergies: No Known Allergies   Physical examination: Blood pressure 110/80, pulse 68, resp. rate 16, SpO2 100 %.  General: Alert, interactive, in no acute distress. HEENT: PERRLA, TMs pearly gray,  turbinates non-edematous without discharge, post-pharynx non erythematous. Neck: Supple without lymphadenopathy. Lungs: Clear to auscultation without wheezing, rhonchi or rales. {no increased work of breathing. CV: Normal S1, S2 without murmurs. Abdomen: Nondistended, nontender. Skin: Warm and dry, without lesions or rashes. Extremities:  No clubbing, cyanosis or edema. Neuro:   Grossly intact.  Diagnositics/Labs: Labs:  Component     Latest Ref Rng & Units 01/20/2020  Class Description Allergens      Comment  IgE (Immunoglobulin E), Serum     6 - 495 IU/mL 44  D Pteronyssinus IgE     Class 0/I kU/L 0.10 (A)  D Farinae IgE     Class 0/I kU/L 0.13 (A)  Cat Dander IgE     Class 0 kU/L <0.10  Dog Dander IgE     Class 0 kU/L <0.10  Susan Chapman IgE     Class 0/I kU/L 0.26 (A)  Susan Chapman IgE     Class 0/I kU/L 0.25 (A)  Susan Chapman IgE     Class 0/I kU/L 0.28 (A)  Cockroach, German IgE     Class 0/I kU/L 0.24 (A)  Penicillium Chrysogen IgE     Class 0 kU/L <0.10  Cladosporium Herbarum IgE     Class 0 kU/L <0.10  Aspergillus Fumigatus IgE     Class 0 kU/L <0.10  Alternaria Alternata IgE     Class 0 kU/L <0.10  Maple/Box Elder IgE     Class 0/I kU/L 0.27 (A)  Susan Chapman IgE     Class 0/I kU/L 0.21 (A)  Cedar, Georgia IgE     Class 0/I kU/L 0.22 (A)  Oak, White IgE     Class 0/I kU/L 0.30 (A)  Elm, American IgE     Class I kU/L 0.32 (A)  Cottonwood IgE     Class 0/I kU/L 0.29 (A)  Pecan, Hickory IgE     Class 0/I kU/L 0.29 (A)  White Mulberry IgE     Class 0/I kU/L 0.19 (A)  Ragweed, Short IgE     Class 0/I kU/L 0.29 (A)  Pigweed, Rough IgE     Class 0/I kU/L 0.26 (A)  Sheep Sorrel IgE Qn     Class 0/I kU/L 0.27 (A)  Mouse Urine IgE     Class 0 kU/L <0.10  Thyroperoxidase Ab SerPl-aCnc     0 - 34 IU/mL 151 (H)  Thyroglobulin Antibody     0.0 - 0.9 IU/mL <1.0  Tryptase     2.2 - 13.2 ug/L 6.8  Sed Rate     0 - 32 mm/hr 9  cu index      <10 39.3 (H)  Anti Nuclear Antibody (ANA)     Negative Negative  Horse dander     Class II kU/L 0.66 (A)     Assessment and plan:   Urticaria with angioedema, chronic  - at this time etiology of hives and swelling is autoimmune in nature.  Hives can be caused by a variety of different triggers including illness/infection, foods, medications, stings, exercise, pressure, vibrations, extremes of temperature to name a few however majority of the time there is no identifiable trigger.   - your labwork revealed that you make an antibody that targets your allergy cells that can cause recurrent episodes of hives/swelling. This is the autoimmune form of hives. You also makes an antibody against a thyroperoxidase antibody which is a thyroid antibody. This does not indicate that you have thyroid disease however may be at increased risk of developing thyroid disease in the future. Environmental allergy panel shows moderate IgE to horse; low IgE to dust mites, tree pollens, Chapman pollens, weed pollens and cockroach. Recommend continued avoidance as much as possible.    - for management of hives recommend the following regimen: Allegra 180mg , Zyrtec 10mg  or Xyzal 5mg   1 tab 1-2 times a day.  If still having hives/swelling with daily antihistamine then add in Pepcid 20mg  1 tab twice a day  - if hives continue let us know then would add Singulair to the regimen  - if hives continue despite the above then Xolair injectable monthly medication will be discussed further for management  Follow-up 6 months or sooner if needed  I appreciate the opportunity to take part in Cindee's care. Please do not hesitate to contact me with questions.  Sincerely,   Prudy Feeler, MD Allergy/Immunology [facility]

## 2020-03-09 NOTE — Patient Instructions (Addendum)
Hives and swelling, chronic  - at this time etiology of hives and swelling is autoimmune in nature.  Hives can be caused by a variety of different triggers including illness/infection, foods, medications, stings, exercise, pressure, vibrations, extremes of temperature to name a few however majority of the time there is no identifiable trigger.   - your labwork revealed that you make an antibody that targets your allergy cells that can cause recurrent episodes of hives/swelling. This is the autoimmune form of hives. You also makes an antibody against a thyroperoxidase antibody which is a thyroid antibody. This does not indicate that you have thyroid disease however may be at increased risk of developing thyroid disease in the future. Environmental allergy panel shows moderate IgE to horse; low IgE to dust mites, tree pollens, grass pollens, weed pollens and cockroach. Recommend continued avoidance as much as possible.    - for management of hives recommend the following regimen: Allegra 180mg , Zyrtec 10mg  or Xyzal 5mg   1 tab 1-2 times a day.  If still having hives/swelling with daily antihistamine then add in Pepcid 20mg  1 tab twice a day  - if hives continue let know then would add Singulair to the regimen  - if hives continue despite the above then Xolair injectable monthly medication will be discussed further for management  Follow-up 6 months or sooner if needed

## 2020-03-22 DIAGNOSIS — Z30432 Encounter for removal of intrauterine contraceptive device: Secondary | ICD-10-CM | POA: Diagnosis not present

## 2020-04-27 ENCOUNTER — Other Ambulatory Visit: Payer: Self-pay

## 2020-04-27 ENCOUNTER — Ambulatory Visit (INDEPENDENT_AMBULATORY_CARE_PROVIDER_SITE_OTHER): Payer: BC Managed Care – PPO | Admitting: Allergy

## 2020-04-27 ENCOUNTER — Encounter: Payer: Self-pay | Admitting: Allergy

## 2020-04-27 VITALS — BP 104/76 | HR 66 | Resp 14

## 2020-04-27 DIAGNOSIS — T783XXD Angioneurotic edema, subsequent encounter: Secondary | ICD-10-CM | POA: Diagnosis not present

## 2020-04-27 DIAGNOSIS — L508 Other urticaria: Secondary | ICD-10-CM

## 2020-04-27 MED ORDER — FEXOFENADINE HCL 180 MG PO TABS: 180.0000 mg | ORAL_TABLET | Freq: Every morning | ORAL | 5 refills | Status: DC

## 2020-04-27 MED ORDER — MONTELUKAST SODIUM 10 MG PO TABS: 10.0000 mg | ORAL_TABLET | Freq: Every day | ORAL | 5 refills | Status: DC

## 2020-04-27 MED ORDER — FAMOTIDINE 20 MG PO TABS: 20.0000 mg | ORAL_TABLET | Freq: Two times a day (BID) | ORAL | 5 refills | Status: DC

## 2020-04-27 NOTE — Patient Instructions (Addendum)
Hives and swelling, chronic  - at this time etiology of hives and swelling is autoimmune in nature.  Hives can be caused by a variety of different triggers including illness/infection, foods, medications, stings, exercise, pressure, vibrations, extremes of temperature to name a few however majority of the time there is no identifiable trigger.   - your labwork revealed that you make an antibody that targets your allergy cells that can cause recurrent episodes of hives/swelling. This is the autoimmune form of hives. You also makes an antibody against a thyroperoxidase antibody which is a thyroid antibody. This does not indicate that you have thyroid disease however may be at increased risk of developing thyroid disease in the future. Environmental allergy panel shows moderate IgE to horse; low IgE to dust mites, tree pollens, grass pollens, weed pollens and cockroach. Recommend continued avoidance as much as possible.    - for management of hives recommend the following regimen:    - Allegra 180mg  in AM and Xyzal 5mg  in PM (if needed the max antihistamine dose if 4 tabs a day).     - Pepcid 20mg  1 tab twice a day   - start Singulair 10mg  at bedtime.  If you notice any change in mood/behavior/sleep after starting Singulair then stop this medication and let know.  Symptoms resolve after stopping the medication.     - if hives continue despite the above then Xolair injectable monthly medication should be initiated.  Xolair benefits and risks discussed today and brochure provided  Follow-up 2-3 months or sooner if needed

## 2020-04-27 NOTE — Progress Notes (Signed)
Follow-up Note  RE: Susan Chapman MRN: [id number] DOB: 1981-01-26 Date of Office Visit: 04/27/2020   History of present illness: Susan Chapman is a 39 y.o. female presenting today for follow-up of urticaria and angioedema.  She was last seen in the office on 03/09/2020 by myself.  She returns today as she has been having increase in her hives.  She states they started on her legs and have been moving of her body.  She states that pretty much every day she has been having hives.  She also states she noted some facial swelling after using NSAID medication.  She is taking the Allegra in the morning and Xyzal in the evening and Pepcid twice a day as well.  She states the hives are getting more bothersome and are less responsive to the antihistamines at this time.  Review of systems: Review of Systems  Constitutional: Negative.   HENT: Negative.   Eyes: Negative.   Respiratory: Negative.   Cardiovascular: Negative.   Gastrointestinal: Negative.   Musculoskeletal: Negative.   Skin: Positive for itching and rash.  Neurological: Negative.     All other systems negative unless noted above in HPI  Past medical/social/surgical/family history have been reviewed and are unchanged unless specifically indicated below.  No changes  Medication List: Current Outpatient Medications  Medication Sig Dispense Refill  . dicyclomine (BENTYL) 10 MG capsule Take 10 mg by mouth 3 (three) times daily.    . famotidine (PEPCID) 20 MG tablet Take 1 tablet (20 mg total) by mouth 2 (two) times daily. 60 tablet 5  . fexofenadine (ALLEGRA) 180 MG tablet Take 1 tablet (180 mg total) by mouth in the morning. 30 tablet 5  . Insulin Disposable Pump (OMNIPOD DASH 5 PACK PODS) MISC USE TO ADMINISTER INSULIN. CHANGE EVERY 72 HOURS    . levocetirizine (XYZAL) 5 MG tablet Take 5 mg by mouth every evening.    . Multiple Vitamin (MULTIVITAMIN) tablet Take 1 tablet by mouth daily.    . Nutritional Supplements  (NUTRITIONAL SUPPLEMENT PO) Take by mouth. Luminex Activate Replenex    . montelukast (SINGULAIR) 10 MG tablet Take 1 tablet (10 mg total) by mouth at bedtime. 30 tablet 5   No current facility-administered medications for this visit.     Known medication allergies: No Known Allergies   Physical examination: Blood pressure 104/76, pulse 66, resp. rate 14, SpO2 99 %.  General: Alert, interactive, in no acute distress. HEENT: PERRLA, TMs pearly gray, turbinates non-edematous without discharge, post-pharynx non erythematous. Neck: Supple without lymphadenopathy. Lungs: Clear to auscultation without wheezing, rhonchi or rales. {no increased work of breathing. CV: Normal S1, S2 without murmurs. Abdomen: Nondistended, nontender. Skin: Scattered erythematous urticarial type lesions primarily located Lower legs bilaterally, right flank area , nonvesicular. Extremities:  No clubbing, cyanosis or edema. Neuro:   Grossly intact.  Diagnositics/Labs: None today  Assessment and plan:   Urticaria with angioedema, chronic  - at this time etiology of hives and swelling is autoimmune in nature.  Hives can be caused by a variety of different triggers including illness/infection, foods, medications, stings, exercise, pressure, vibrations, extremes of temperature to name a few however majority of the time there is no identifiable trigger.   - your labwork revealed that you make an antibody that targets your allergy cells that can cause recurrent episodes of hives/swelling. This is the autoimmune form of hives. You also makes an antibody against a thyroperoxidase antibody which is a thyroid antibody. This does not  indicate that you have thyroid disease however may be at increased risk of developing thyroid disease in the future. Environmental allergy panel shows moderate IgE to horse; low IgE to dust mites, tree pollens, grass pollens, weed pollens and cockroach. Recommend continued avoidance as much  as possible.    - for management of hives recommend the following regimen:    - Allegra 180mg  in AM and Xyzal 5mg  in PM (if needed the max antihistamine dose if 4 tabs a day).     - Pepcid 20mg  1 tab twice a day   - start Singulair 10mg  at bedtime.  If you notice any change in mood/behavior/sleep after starting Singulair then stop this medication and let know.  Symptoms resolve after stopping the medication.     - if hives continue despite the above then Xolair injectable monthly medication should be initiated.  Xolair benefits and risks discussed today and brochure provided  Follow-up 2-3 months or sooner if needed  I appreciate the opportunity to take part in Susan Chapman's care. Please do not hesitate to contact me with questions.  Sincerely,   , MD Allergy/Immunology [facility]

## 2020-04-28 DIAGNOSIS — E109 Type 1 diabetes mellitus without complications: Secondary | ICD-10-CM | POA: Diagnosis not present

## 2020-04-28 DIAGNOSIS — Z794 Long term (current) use of insulin: Secondary | ICD-10-CM | POA: Diagnosis not present

## 2020-05-05 NOTE — Telephone Encounter (Signed)
Called patient and advised approval and copay card. Due to Ins will buy and bill and scheduled patient to start 7/29

## 2020-05-06 ENCOUNTER — Other Ambulatory Visit: Payer: Self-pay

## 2020-05-06 ENCOUNTER — Ambulatory Visit (INDEPENDENT_AMBULATORY_CARE_PROVIDER_SITE_OTHER): Payer: BC Managed Care – PPO

## 2020-05-06 DIAGNOSIS — L501 Idiopathic urticaria: Secondary | ICD-10-CM

## 2020-05-06 DIAGNOSIS — L508 Other urticaria: Secondary | ICD-10-CM

## 2020-05-06 MED ORDER — OMALIZUMAB 150 MG ~~LOC~~ SOLR
300.0000 mg | SUBCUTANEOUS | Status: DC
  Administered 2020-05-06 – 2020-10-21 (×7): 300 mg via SUBCUTANEOUS

## 2020-05-06 MED ORDER — EPINEPHRINE 0.3 MG/0.3ML IJ SOAJ: 0.3000 mg | INTRAMUSCULAR | 3 refills | Status: AC | PRN

## 2020-05-06 NOTE — Progress Notes (Signed)
Patient was started on Xolair today in office. Handout given on biologic injection times and epi pen teach instruction done as well. Patient tolerated injections well and no side effects seen at injection sites.

## 2020-06-02 DIAGNOSIS — L501 Idiopathic urticaria: Secondary | ICD-10-CM

## 2020-06-03 ENCOUNTER — Ambulatory Visit (INDEPENDENT_AMBULATORY_CARE_PROVIDER_SITE_OTHER): Payer: BC Managed Care – PPO | Admitting: *Deleted

## 2020-06-03 ENCOUNTER — Other Ambulatory Visit: Payer: Self-pay

## 2020-06-03 DIAGNOSIS — L501 Idiopathic urticaria: Secondary | ICD-10-CM

## 2020-06-07 ENCOUNTER — Other Ambulatory Visit: Payer: Self-pay

## 2020-06-07 ENCOUNTER — Encounter: Payer: Self-pay | Admitting: Physician Assistant

## 2020-06-07 ENCOUNTER — Ambulatory Visit (INDEPENDENT_AMBULATORY_CARE_PROVIDER_SITE_OTHER): Payer: BC Managed Care – PPO | Admitting: Physician Assistant

## 2020-06-07 VITALS — BP 114/78 | HR 93 | Temp 98.8°F | Ht 67.0 in | Wt 154.0 lb

## 2020-06-07 DIAGNOSIS — L508 Other urticaria: Secondary | ICD-10-CM

## 2020-06-07 NOTE — Progress Notes (Signed)
Acute Office Visit  Subjective:    Patient ID: Susan Chapman, female    DOB: 03-Jan-1981, 39 y.o.   MRN: [id number]  Chief Complaint  Patient presents with   Rash    HPI Patient is in today for chronic urticaria - pt states that since March she has new onset of urticaria - she has been seeing Allergy specialist here in [place] and is receiving treatment including allegar, pepcid, and has had 2 doses of Xolair - She would like to be referred to immunologist/allergist at [facility] for second opinion - Dr Marily Memos  Past Medical History:  Diagnosis Date   Irritable bowel syndrome with diarrhea    Type 1 diabetes mellitus without complications (HCC)    Urticaria     History reviewed. No pertinent surgical history.  Family History  Problem Relation Age of Onset   Diabetes type I Mother    High blood pressure Mother    Cancer Maternal Grandmother     Social History   Socioeconomic History   Marital status: Single    Spouse name: Not on file   Number of children: Not on file   Years of education: Not on file   Highest education level: Not on file  Occupational History   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[employer]  Tobacco Use   Smoking status: Former Smoker    Types: Cigarettes    Quit date: 2017    Years since quitting: 4.6   Smokeless tobacco: Never Used   Tobacco comment: Smoked a couple of years, 2 to 3 cigs per day  Vaping Use   Vaping Use: Never used  Substance and Sexual Activity   Alcohol use: Yes    Comment: rarely   Drug use: Never   Sexual activity: Not Currently  Other Topics Concern   Not on file  Social History Narrative   Not on file   Social Determinants of Health   Financial Resource Strain:    Difficulty of Paying Living Expenses: Not on file  Food Insecurity:    Worried About Programme researcher, broadcasting/film/video in the Last Year: Not on file   [employer] of Food in the Last Year: Not on file  Transportation Needs:    Lack of Transportation  (Medical): Not on file   Lack of Transportation (Non-Medical): Not on file  Physical Activity:    Days of Exercise per Week: Not on file   Minutes of Exercise per Session: Not on file  Stress:    Feeling of Stress : Not on file  Social Connections:    Frequency of Communication with Friends and Family: Not on file   Frequency of Social Gatherings with Friends and Family: Not on file   Attends Religious Services: Not on file   Active Member of Clubs or Organizations: Not on file   Attends Banker Meetings: Not on file   Marital Status: Not on file  Intimate Partner Violence:    Fear of Current or Ex-Partner: Not on file   Emotionally Abused: Not on file   Physically Abused: Not on file   Sexually Abused: Not on file     Current Outpatient Medications:    dicyclomine (BENTYL) 10 MG capsule, Take 10 mg by mouth 3 (three) times daily., Disp: , Rfl:    EPINEPHrine 0.3 mg/0.3 mL IJ SOAJ injection, Inject 0.3 mLs (0.3 mg total) into the muscle as needed for anaphylaxis., Disp: 2 each, Rfl: 3   famotidine (PEPCID) 20 MG tablet, Take  1 tablet (20 mg total) by mouth 2 (two) times daily., Disp: 60 tablet, Rfl: 5   fexofenadine (ALLEGRA) 180 MG tablet, Take 1 tablet (180 mg total) by mouth in the morning., Disp: 30 tablet, Rfl: 5   Insulin Disposable Pump (OMNIPOD DASH 5 PACK PODS) MISC, USE TO ADMINISTER INSULIN. CHANGE EVERY 72 HOURS, Disp: , Rfl:    Multiple Vitamin (MULTIVITAMIN) tablet, Take 1 tablet by mouth daily., Disp: , Rfl:    Nutritional Supplements (NUTRITIONAL SUPPLEMENT PO), Take by mouth. Luminex Activate Replenex, Disp: , Rfl:   Current Facility-Administered Medications:    omalizumab Geoffry Paradise) injection 300 mg, 300 mg, Subcutaneous, Q28 days, Kozlow, Alvira Philips, MD, 300 mg at 06/03/20 1044   No Known Allergies  CONSTITUTIONAL: Negative for chills, fatigue, fever, unintentional weight gain and unintentional weight loss.  CARDIOVASCULAR: Negative  for chest pain, dizziness, palpitations and pedal edema.  RESPIRATORY: Negative for recent cough and dyspnea.  INTEGUMENTARY: see HPI         Objective:    PHYSICAL EXAM:   VS: BP 114/78    Pulse 93    Temp 98.8 F (37.1 C)    Ht 5\' 7"  (1.702 m)    Wt 154 lb (69.9 kg)    SpO2 100%    BMI 24.12 kg/m   GEN: Well nourished, well developed, in no acute distress  Cardiac: RRR; no murmurs, rubs, or gallops,no edema - no significant varicosities Respiratory:  normal respiratory rate and pattern with no distress - normal breath sounds with no rales, rhonchi, wheezes or rubs Skin: few urticarial lesions noted on lower legs Psych: euthymic mood, appropriate affect and demeanor   Wt Readings from Last 3 Encounters:  06/07/20 154 lb (69.9 kg)  01/20/20 153 lb (69.4 kg)  12/30/19 154 lb 3.2 oz (69.9 kg)    Health Maintenance Due  Topic Date Due   HEMOGLOBIN A1C  Never done   FOOT EXAM  Never done   URINE MICROALBUMIN  Never done   OPHTHALMOLOGY EXAM  02/07/2020   INFLUENZA VACCINE  05/09/2020    There are no preventive care reminders to display for this patient.        Assessment & Plan:   Problem List Items Addressed This Visit      Musculoskeletal and Integument   Chronic urticaria - Primary    Referral to Dr 07/09/2020 at [facility] per pt request      Relevant Orders   Ambulatory referral to Allergy       No orders of the defined types were placed in this encounter.    SARA R Bijou Easler, PA-C

## 2020-06-07 NOTE — Assessment & Plan Note (Signed)
Referral to Dr Marily Memos at [facility] per pt request

## 2020-06-13 ENCOUNTER — Encounter: Payer: Self-pay | Admitting: Physician Assistant

## 2020-06-30 DIAGNOSIS — L501 Idiopathic urticaria: Secondary | ICD-10-CM

## 2020-07-01 ENCOUNTER — Other Ambulatory Visit: Payer: Self-pay

## 2020-07-01 ENCOUNTER — Ambulatory Visit (INDEPENDENT_AMBULATORY_CARE_PROVIDER_SITE_OTHER): Payer: BC Managed Care – PPO

## 2020-07-01 DIAGNOSIS — L501 Idiopathic urticaria: Secondary | ICD-10-CM | POA: Diagnosis not present

## 2020-07-28 DIAGNOSIS — L501 Idiopathic urticaria: Secondary | ICD-10-CM | POA: Diagnosis not present

## 2020-07-29 ENCOUNTER — Ambulatory Visit (INDEPENDENT_AMBULATORY_CARE_PROVIDER_SITE_OTHER): Payer: BC Managed Care – PPO | Admitting: *Deleted

## 2020-07-29 ENCOUNTER — Other Ambulatory Visit: Payer: Self-pay

## 2020-07-29 DIAGNOSIS — L501 Idiopathic urticaria: Secondary | ICD-10-CM | POA: Diagnosis not present

## 2020-08-04 DIAGNOSIS — Z20828 Contact with and (suspected) exposure to other viral communicable diseases: Secondary | ICD-10-CM | POA: Diagnosis not present

## 2020-08-25 DIAGNOSIS — L501 Idiopathic urticaria: Secondary | ICD-10-CM | POA: Diagnosis not present

## 2020-08-26 ENCOUNTER — Ambulatory Visit (INDEPENDENT_AMBULATORY_CARE_PROVIDER_SITE_OTHER): Payer: BC Managed Care – PPO

## 2020-08-26 ENCOUNTER — Ambulatory Visit: Payer: BC Managed Care – PPO

## 2020-08-26 DIAGNOSIS — L501 Idiopathic urticaria: Secondary | ICD-10-CM | POA: Diagnosis not present

## 2020-09-01 DIAGNOSIS — E109 Type 1 diabetes mellitus without complications: Secondary | ICD-10-CM | POA: Diagnosis not present

## 2020-09-01 DIAGNOSIS — Z794 Long term (current) use of insulin: Secondary | ICD-10-CM | POA: Diagnosis not present

## 2020-09-16 ENCOUNTER — Other Ambulatory Visit: Payer: Self-pay

## 2020-09-16 ENCOUNTER — Ambulatory Visit (INDEPENDENT_AMBULATORY_CARE_PROVIDER_SITE_OTHER): Payer: BC Managed Care – PPO | Admitting: Physician Assistant

## 2020-09-16 ENCOUNTER — Encounter: Payer: Self-pay | Admitting: Physician Assistant

## 2020-09-16 VITALS — BP 110/70 | HR 61 | Temp 97.5°F | Ht 67.0 in | Wt 154.6 lb

## 2020-09-16 DIAGNOSIS — Z1231 Encounter for screening mammogram for malignant neoplasm of breast: Secondary | ICD-10-CM | POA: Diagnosis not present

## 2020-09-16 DIAGNOSIS — Z0000 Encounter for general adult medical examination without abnormal findings: Secondary | ICD-10-CM | POA: Insufficient documentation

## 2020-09-16 MED ORDER — DICYCLOMINE HCL 10 MG PO CAPS: 10.0000 mg | ORAL_CAPSULE | Freq: Two times a day (BID) | ORAL | 3 refills | Status: DC

## 2020-09-16 NOTE — Patient Instructions (Signed)

## 2020-09-16 NOTE — Progress Notes (Signed)
Subjective:  Patient ID: Susan Chapman, female    DOB: [birth year]  Age: 39 y.o. MRN: [id number]  Chief Complaint  Patient presents with  . Annual Exam    HPI Well Adult Physical: Patient here for a comprehensive physical exam.The patient reports no problems Do you take any herbs or supplements that were not prescribed by a doctor? no Encounter for general adult medical examination without abnormal findings  Physical ("At Risk" items are starred): Patient's last physical exam was 1 year ago .  Smoking: Life-long non-smoker ;  Physical Activity: Exercises at least 3 times per week ;  Alcohol/Drug Use: Is a non-drinker ; No illicit drug use ;  Dental Care: biannual cleanings, brushes and flosses daily. Ophthalmology/Optometry: Annual visit.  Hearing loss: none Vision impairments: none  Self breast exams: occasionally - due for mammogram  Pt follows regularly with endocrinologist every 3-4 months  Flowsheet Row Office Visit from 12/30/2019 in [facility]  PHQ-2 Total Score 0              Social Hx   Social History   Socioeconomic History  . Marital status: Single    Spouse name: Not on file  . Number of children: Not on file  . Years of education: Not on file  . Highest education level: Not on file  Occupational History  . 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 Smoking status: Former Smoker    Types: Cigarettes    Quit date: 2017    Years since quitting: 4.9  . Smokeless tobacco: Never Used  . Tobacco comment: Smoked a couple of years, 2 to 3 cigs per day  Vaping Use  . Vaping Use: Never used  Substance and Sexual Activity  . Alcohol use: Yes    Comment: rarely  . Drug use: Never  . Sexual activity: Not Currently  Other Topics Concern  . Not on file  Social History Narrative  . Not on file   Social Determinants of Health   Financial Resource Strain: Not on file  Food Insecurity: Not on file  Transportation Needs: Not on file  Physical  Activity: Not on file  Stress: Not on file  Social Connections: Not on file   Past Medical History:  Diagnosis Date  . Irritable bowel syndrome with diarrhea   . Type 1 diabetes mellitus without complications (HCC)   . Urticaria    History reviewed. No pertinent surgical history.  Family History  Problem Relation Age of Onset  . Diabetes type I Mother   . High blood pressure Mother   . Cancer Maternal Grandmother     Review of Systems  CONSTITUTIONAL: Negative for chills, fatigue, fever, unintentional weight gain and unintentional weight loss.  E/N/T: Negative for ear pain, nasal congestion and sore throat.  CARDIOVASCULAR: Negative for chest pain, dizziness, palpitations and pedal edema.  RESPIRATORY: Negative for recent cough and dyspnea.  GASTROINTESTINAL: Negative for abdominal pain, acid reflux symptoms, constipation, diarrhea, nausea and vomiting.  MSK: Negative for arthralgias and myalgias.  INTEGUMENTARY: Negative for rash.  NEUROLOGICAL: Negative for dizziness and headaches.  PSYCHIATRIC: Negative for sleep disturbance and to question depression screen.  Negative for depression, negative for anhedonia.      Objective:  BP 110/70 (BP Location: Right Arm, Patient Position: Sitting, Cuff Size: Normal)   Pulse 61   Temp (!) 97.5 F (36.4 C) (Temporal)   Ht 5\' 7"  (1.702 m)   Wt 154 lb 9.6 oz (70.1 kg)  SpO2 99%   BMI 24.21 kg/m   BP/Weight 09/16/2020 06/07/2020 04/27/2020  Systolic BP 110 114 104  Diastolic BP 70 78 76  Wt. (Lbs) 154.6 154 -  BMI 24.21 24.12 -    Physical Exam PHYSICAL EXAM:   VS: BP 110/70 (BP Location: Right Arm, Patient Position: Sitting, Cuff Size: Normal)   Pulse 61   Temp (!) 97.5 F (36.4 C) (Temporal)   Ht 5\' 7"  (1.702 m)   Wt 154 lb 9.6 oz (70.1 kg)   SpO2 99%   BMI 24.21 kg/m   GEN: Well nourished, well developed, in no acute distress  HEENT: normal external ears and nose - normal external auditory canals and TMS - hearing  grossly normal - normal nasal mucosa and septum - Lips, Teeth and Gums - normal  Oropharynx - normal mucosa, palate, and posterior pharynx Neck: no JVD or masses - no thyromegaly Cardiac: RRR; no murmurs, rubs, or gallops,no edema -  Respiratory:  normal respiratory rate and pattern with no distress - normal breath sounds with no rales, rhonchi, wheezes or rubs GI: normal bowel sounds, no masses or tenderness MS: no deformity or atrophy  Skin: warm and dry, no rash  Neuro:  Alert and Oriented x 3, Strength and sensation are intact - CN II-Xii grossly intact Psych: euthymic mood, appropriate affect and demeanor  No results found for: WBC, HGB, HCT, PLT, GLUCOSE, CHOL, TRIG, HDL, LDLDIRECT, LDLCALC, ALT, AST, NA, K, CL, CREATININE, BUN, CO2, TSH, PSA, INR, GLUF, HGBA1C, MICROALBUR    Assessment & Plan:  1. Routine physical examination - CBC with Differential/Platelet - Comprehensive metabolic panel - TSH - Lipid panel  2. Encounter for screening mammogram for breast cancer - MM Digital Screening    Body mass index is 24.21 kg/m.   These are the goals we discussed: Goals   None      This is a list of the screening recommended for you and due dates:  Health Maintenance  Topic Date Due  . Hemoglobin A1C  Never done  . Complete foot exam   Never done  . Urine Protein Check  Never done  . COVID-19 Vaccine (1) 10/02/2020*  . Flu Shot  01/06/2021*  . Pneumococcal vaccine  06/07/2021*  . Tetanus Vaccine  06/07/2021*  . Eye exam for diabetics  02/08/2021  . Pap Smear  07/29/2021  .  Hepatitis C: One time screening is recommended by Center for Disease Control  (CDC) for  adults born from [birth year] through 1966.   Discontinued  . HIV Screening  Discontinued  *Topic was postponed. The date shown is not the original due date.     AN INDIVIDUALIZED CARE PLAN: was established or reinforced today.   SELF MANAGEMENT: The patient and I together assessed ways to personally work towards  obtaining the recommended goals  Support needs The patient and/or family needs were assessed and services were offered if appropriate.  Meds ordered this encounter  Medications  . dicyclomine (BENTYL) 10 MG capsule    Sig: Take 1 capsule (10 mg total) by mouth in the morning and at bedtime.    Dispense:  180 capsule    Refill:  3    Order Specific Question:   Supervising Provider    Answer1966    Follow-up: Return in about 1 year (around 09/16/2021) for pap smear/physical.  An After Visit Summary was printed and given to the patient.  14/06/2021, PA-C [facility] (  [phone]  

## 2020-09-17 ENCOUNTER — Other Ambulatory Visit: Payer: Self-pay | Admitting: Physician Assistant

## 2020-09-17 DIAGNOSIS — R799 Abnormal finding of blood chemistry, unspecified: Secondary | ICD-10-CM

## 2020-09-17 LAB — COMPREHENSIVE METABOLIC PANEL
ALT: 12 IU/L (ref 0–32)
AST: 16 IU/L (ref 0–40)
Albumin/Globulin Ratio: 1.9 (ref 1.2–2.2)
Albumin: 4.5 g/dL (ref 3.8–4.8)
Alkaline Phosphatase: 44 IU/L (ref 44–121)
BUN/Creatinine Ratio: 15 (ref 9–23)
BUN: 10 mg/dL (ref 6–24)
Bilirubin Total: 0.3 mg/dL (ref 0.0–1.2)
CO2: 21 mmol/L (ref 20–29)
Calcium: 9.6 mg/dL (ref 8.7–10.2)
Chloride: 105 mmol/L (ref 96–106)
Creatinine, Ser: 0.66 mg/dL (ref 0.57–1.00)
GFR calc Af Amer: 128 mL/min/{1.73_m2} (ref >59)
GFR calc non Af Amer: 111 mL/min/{1.73_m2} (ref >59)
Globulin, Total: 2.4 g/dL (ref 1.5–4.5)
Glucose: 151 mg/dL — ABNORMAL HIGH (ref 65–99)
Potassium: 5.2 mmol/L (ref 3.5–5.2)
Sodium: 139 mmol/L (ref 134–144)
Total Protein: 6.9 g/dL (ref 6.0–8.5)

## 2020-09-17 LAB — CBC WITH DIFFERENTIAL/PLATELET
Basophils Absolute: 0 10*3/uL (ref 0.0–0.2)
Basos: 0 %
EOS (ABSOLUTE): 0.1 10*3/uL (ref 0.0–0.4)
Eos: 2 %
Hematocrit: 40.5 % (ref 34.0–46.6)
Hemoglobin: 13.3 g/dL (ref 11.1–15.9)
Immature Grans (Abs): 0 10*3/uL (ref 0.0–0.1)
Immature Granulocytes: 0 %
Lymphocytes Absolute: 2.4 10*3/uL (ref 0.7–3.1)
Lymphs: 37 %
MCH: 29.4 pg (ref 26.6–33.0)
MCHC: 32.8 g/dL (ref 31.5–35.7)
MCV: 90 fL (ref 79–97)
Monocytes Absolute: 0.5 10*3/uL (ref 0.1–0.9)
Monocytes: 8 %
Neutrophils Absolute: 3.5 10*3/uL (ref 1.4–7.0)
Neutrophils: 53 %
Platelets: 363 10*3/uL (ref 150–450)
RBC: 4.52 x10E6/uL (ref 3.77–5.28)
RDW: 12.2 % (ref 11.7–15.4)
WBC: 6.5 10*3/uL (ref 3.4–10.8)

## 2020-09-17 LAB — LIPID PANEL
Chol/HDL Ratio: 2.6 ratio (ref 0.0–4.4)
Cholesterol, Total: 162 mg/dL (ref 100–199)
HDL: 62 mg/dL (ref >39)
LDL Chol Calc (NIH): 82 mg/dL (ref 0–99)
Triglycerides: 98 mg/dL (ref 0–149)
VLDL Cholesterol Cal: 18 mg/dL (ref 5–40)

## 2020-09-17 LAB — CARDIOVASCULAR RISK ASSESSMENT

## 2020-09-17 LAB — TSH: TSH: 3.58 u[IU]/mL (ref 0.450–4.500)

## 2020-09-22 DIAGNOSIS — L501 Idiopathic urticaria: Secondary | ICD-10-CM | POA: Diagnosis not present

## 2020-09-23 ENCOUNTER — Other Ambulatory Visit: Payer: Self-pay

## 2020-09-23 ENCOUNTER — Ambulatory Visit (INDEPENDENT_AMBULATORY_CARE_PROVIDER_SITE_OTHER): Payer: BC Managed Care – PPO | Admitting: *Deleted

## 2020-09-23 DIAGNOSIS — L501 Idiopathic urticaria: Secondary | ICD-10-CM | POA: Diagnosis not present

## 2020-09-28 ENCOUNTER — Encounter: Payer: Self-pay | Admitting: Physician Assistant

## 2020-10-09 LAB — SPECIMEN STATUS REPORT

## 2020-10-09 LAB — HGB A1C W/O EAG: Hgb A1c MFr Bld: 6.2 % — ABNORMAL HIGH (ref 4.8–5.6)

## 2020-10-20 DIAGNOSIS — L501 Idiopathic urticaria: Secondary | ICD-10-CM | POA: Diagnosis not present

## 2020-10-20 DIAGNOSIS — L508 Other urticaria: Secondary | ICD-10-CM | POA: Diagnosis not present

## 2020-10-21 ENCOUNTER — Ambulatory Visit (INDEPENDENT_AMBULATORY_CARE_PROVIDER_SITE_OTHER): Payer: BC Managed Care – PPO | Admitting: *Deleted

## 2020-10-21 ENCOUNTER — Other Ambulatory Visit: Payer: Self-pay

## 2020-10-21 DIAGNOSIS — L501 Idiopathic urticaria: Secondary | ICD-10-CM | POA: Diagnosis not present

## 2020-10-26 ENCOUNTER — Other Ambulatory Visit: Payer: Self-pay | Admitting: Allergy

## 2020-11-17 DIAGNOSIS — L501 Idiopathic urticaria: Secondary | ICD-10-CM | POA: Diagnosis not present

## 2020-11-18 ENCOUNTER — Other Ambulatory Visit: Payer: Self-pay

## 2020-11-18 ENCOUNTER — Ambulatory Visit (INDEPENDENT_AMBULATORY_CARE_PROVIDER_SITE_OTHER): Payer: BC Managed Care – PPO

## 2020-11-18 DIAGNOSIS — L501 Idiopathic urticaria: Secondary | ICD-10-CM

## 2020-11-18 MED ORDER — OMALIZUMAB 150 MG/ML ~~LOC~~ SOSY
300.0000 mg | PREFILLED_SYRINGE | SUBCUTANEOUS | Status: DC
  Administered 2020-11-18 – 2021-04-07 (×6): 300 mg via SUBCUTANEOUS

## 2020-12-15 DIAGNOSIS — L501 Idiopathic urticaria: Secondary | ICD-10-CM

## 2020-12-16 ENCOUNTER — Other Ambulatory Visit: Payer: Self-pay

## 2020-12-16 ENCOUNTER — Ambulatory Visit (INDEPENDENT_AMBULATORY_CARE_PROVIDER_SITE_OTHER): Payer: BC Managed Care – PPO | Admitting: *Deleted

## 2020-12-16 DIAGNOSIS — L501 Idiopathic urticaria: Secondary | ICD-10-CM | POA: Diagnosis not present

## 2020-12-30 DIAGNOSIS — Z794 Long term (current) use of insulin: Secondary | ICD-10-CM | POA: Diagnosis not present

## 2020-12-30 DIAGNOSIS — E109 Type 1 diabetes mellitus without complications: Secondary | ICD-10-CM | POA: Diagnosis not present

## 2021-01-12 DIAGNOSIS — L501 Idiopathic urticaria: Secondary | ICD-10-CM | POA: Diagnosis not present

## 2021-01-13 ENCOUNTER — Ambulatory Visit (INDEPENDENT_AMBULATORY_CARE_PROVIDER_SITE_OTHER): Payer: BC Managed Care – PPO | Admitting: *Deleted

## 2021-01-13 ENCOUNTER — Other Ambulatory Visit: Payer: Self-pay

## 2021-01-13 DIAGNOSIS — L501 Idiopathic urticaria: Secondary | ICD-10-CM

## 2021-02-09 DIAGNOSIS — L501 Idiopathic urticaria: Secondary | ICD-10-CM | POA: Diagnosis not present

## 2021-02-10 ENCOUNTER — Ambulatory Visit (INDEPENDENT_AMBULATORY_CARE_PROVIDER_SITE_OTHER): Payer: BC Managed Care – PPO | Admitting: *Deleted

## 2021-02-10 ENCOUNTER — Other Ambulatory Visit: Payer: Self-pay

## 2021-02-10 DIAGNOSIS — L501 Idiopathic urticaria: Secondary | ICD-10-CM | POA: Diagnosis not present

## 2021-02-18 DIAGNOSIS — E109 Type 1 diabetes mellitus without complications: Secondary | ICD-10-CM | POA: Diagnosis not present

## 2021-02-24 ENCOUNTER — Encounter: Payer: Self-pay | Admitting: Physician Assistant

## 2021-03-09 ENCOUNTER — Other Ambulatory Visit: Payer: Self-pay

## 2021-03-09 ENCOUNTER — Encounter: Payer: Self-pay | Admitting: Physician Assistant

## 2021-03-09 ENCOUNTER — Ambulatory Visit (INDEPENDENT_AMBULATORY_CARE_PROVIDER_SITE_OTHER): Payer: BC Managed Care – PPO | Admitting: Physician Assistant

## 2021-03-09 VITALS — BP 120/78 | HR 81 | Temp 97.5°F | Ht 67.0 in | Wt 155.0 lb

## 2021-03-09 DIAGNOSIS — F5101 Primary insomnia: Secondary | ICD-10-CM

## 2021-03-09 DIAGNOSIS — L501 Idiopathic urticaria: Secondary | ICD-10-CM | POA: Diagnosis not present

## 2021-03-09 MED ORDER — QUETIAPINE FUMARATE 50 MG PO TABS: 50.0000 mg | ORAL_TABLET | Freq: Every day | ORAL | 2 refills | Status: DC

## 2021-03-09 NOTE — Progress Notes (Signed)
Acute Office Visit  Subjective:    Patient ID: Susan Chapman, female    DOB: 06/22/1981, 40 y.o.   MRN: [id number]  Chief Complaint  Patient presents with  . Insomnia    Has had difficulty since HS. Has tried otc medications which do no help and has even taken a family members sleeping medication (seroquel 100mg  but only takes 1/4 of a tablet. Sometimes takes 1/2) either way this medication does not help very much either.    HPI Patient is in today for insomnia - states she has had trouble for years but more recently has worsened - she had been having trouble falling asleep and staying asleep - getting about 4-5 hours of broken sleep She had tried melatonin, tylenol pm , benadryl and nothing helped She actually has been taking a family members medication of seroquel for a few weeks now and states is helpful - she falls asleep well and gets about 7-8 hours per sleep per night with minimal awakenings  Past Medical History:  Diagnosis Date  . Irritable bowel syndrome with diarrhea   . Type 1 diabetes mellitus without complications (HCC)   . Urticaria     History reviewed. No pertinent surgical history.  Family History  Problem Relation Age of Onset  . Diabetes type I Mother   . High blood pressure Mother   . Cancer Maternal Grandmother     Social History   Socioeconomic History  . Marital status: Single    Spouse name: Not on file  . Number of children: Not on file  . Years of education: Not on file  . Highest education level: Not on file  Occupational History  . 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 Smoking status: Former Smoker    Types: Cigarettes    Quit date: 2017    Years since quitting: 5.4  . Smokeless tobacco: Never Used  . Tobacco comment: Smoked a couple of years, 2 to 3 cigs per day  Vaping Use  . Vaping Use: Never used  Substance and Sexual Activity  . Alcohol use: Yes    Comment: rarely  . Drug use: Never  . Sexual activity: Not Currently   Other Topics Concern  . Not on file  Social History Narrative  . Not on file   Social Determinants of Health   Financial Resource Strain: Not on file  Food Insecurity: Not on file  Transportation Needs: Not on file  Physical Activity: Not on file  Stress: Not on file  Social Connections: Not on file  Intimate Partner Violence: Not on file    Outpatient Medications Prior to Visit  Medication Sig Dispense Refill  . CONTOUR NEXT TEST test strip SMARTSIG:Via Meter 8 Times Daily    . dicyclomine (BENTYL) 10 MG capsule Take 1 capsule (10 mg total) by mouth in the morning and at bedtime. 180 capsule 3  . EPINEPHrine 0.3 mg/0.3 mL IJ SOAJ injection Inject 0.3 mLs (0.3 mg total) into the muscle as needed for anaphylaxis. 2 each 3  . fexofenadine (ALLEGRA) 180 MG tablet TAKE 1 TABLET (180 MG TOTAL) BY MOUTH IN THE MORNING. 30 tablet 5  . Insulin Disposable Pump (OMNIPOD DASH 5 PACK PODS) MISC USE TO ADMINISTER INSULIN. CHANGE EVERY 72 HOURS    . Multiple Vitamin (MULTIVITAMIN) tablet Take 1 tablet by mouth daily.    2018 NOVOLOG 100 UNIT/ML injection Inject into the skin.    . Nutritional Supplements (NUTRITIONAL SUPPLEMENT PO) Take by mouth.  Luminex Activate Replenex    . rosuvastatin (CRESTOR) 5 MG tablet Take 5 mg by mouth daily.     Facility-Administered Medications Prior to Visit  Medication Dose Route Frequency Provider Last Rate Last Admin  . omalizumab Geoffry Paradise) injection 300 mg  300 mg Subcutaneous Q28 days Jessica Priest, MD   300 mg at 10/21/20 1407  . omalizumab Geoffry Paradise) prefilled syringe 300 mg  300 mg Subcutaneous Q28 days Nehemiah Settle, FNP   300 mg at 02/10/21 1128    No Known Allergies  Review of Systems CONSTITUTIONAL: Negative for chills, fatigue, fever, unintentional weight gain and unintentional weight loss.   CARDIOVASCULAR: Negative for chest pain, dizziness, palpitations and pedal edema.  RESPIRATORY: Negative for recent cough and dyspnea.   PSYCHIATRIC:see  HPI        Objective:    Physical Exam PHYSICAL EXAM:   VS: BP 120/78   Pulse 81   Temp (!) 97.5 F (36.4 C)   Ht 5\' 7"  (1.702 m)   Wt 155 lb (70.3 kg)   SpO2 100%   BMI 24.28 kg/m   GEN: Well nourished, well developed, in no acute distress   Cardiac: RRR; no murmurs, rubs, or gallops, Respiratory:  normal respiratory rate and pattern with no distress - normal breath sounds with no rales, rhonchi, wheezes or rubs  Psych: euthymic mood, appropriate affect and demeanor  BP 120/78   Pulse 81   Temp (!) 97.5 F (36.4 C)   Ht 5\' 7"  (1.702 m)   Wt 155 lb (70.3 kg)   SpO2 100%   BMI 24.28 kg/m  Wt Readings from Last 3 Encounters:  03/09/21 155 lb (70.3 kg)  09/16/20 154 lb 9.6 oz (70.1 kg)  06/07/20 154 lb (69.9 kg)    Health Maintenance Due  Topic Date Due  . FOOT EXAM  Never done  . URINE MICROALBUMIN  Never done  . OPHTHALMOLOGY EXAM  02/08/2021    There are no preventive care reminders to display for this patient.   Lab Results  Component Value Date   TSH 3.580 09/16/2020   Lab Results  Component Value Date   WBC 6.5 09/16/2020   HGB 13.3 09/16/2020   HCT 40.5 09/16/2020   MCV 90 09/16/2020   PLT 363 09/16/2020   Lab Results  Component Value Date   NA 139 09/16/2020   K 5.2 09/16/2020   CO2 21 09/16/2020   GLUCOSE 151 (H) 09/16/2020   BUN 10 09/16/2020   CREATININE 0.66 09/16/2020   BILITOT 0.3 09/16/2020   ALKPHOS 44 09/16/2020   AST 16 09/16/2020   ALT 12 09/16/2020   PROT 6.9 09/16/2020   ALBUMIN 4.5 09/16/2020   CALCIUM 9.6 09/16/2020   Lab Results  Component Value Date   CHOL 162 09/16/2020   Lab Results  Component Value Date   HDL 62 09/16/2020   Lab Results  Component Value Date   LDLCALC 82 09/16/2020   Lab Results  Component Value Date   TRIG 98 09/16/2020   Lab Results  Component Value Date   CHOLHDL 2.6 09/16/2020   Lab Results  Component Value Date   HGBA1C 6.2 (H) 09/16/2020       Assessment & Plan:   1. Primary insomnia - QUEtiapine (SEROQUEL) 50 MG tablet; Take 1 tablet (50 mg total) by mouth at bedtime.  Dispense: 30 tablet; Refill: 2    Meds ordered this encounter  Medications  . QUEtiapine (SEROQUEL) 50 MG tablet  Sig: Take 1 tablet (50 mg total) by mouth at bedtime.    Dispense:  30 tablet    Refill:  2    Order Specific Question:   Supervising Provider    Answer:   Corey Harold    No orders of the defined types were placed in this encounter.     Follow-up: Return if symptoms worsen or fail to improve.  An After Visit Summary was printed and given to the patient.  Jettie Pagan [facility] [phone]

## 2021-03-10 ENCOUNTER — Ambulatory Visit (INDEPENDENT_AMBULATORY_CARE_PROVIDER_SITE_OTHER): Payer: BC Managed Care – PPO | Admitting: *Deleted

## 2021-03-10 ENCOUNTER — Other Ambulatory Visit: Payer: Self-pay

## 2021-03-10 DIAGNOSIS — L501 Idiopathic urticaria: Secondary | ICD-10-CM

## 2021-03-16 ENCOUNTER — Other Ambulatory Visit: Payer: Self-pay

## 2021-03-16 ENCOUNTER — Ambulatory Visit (INDEPENDENT_AMBULATORY_CARE_PROVIDER_SITE_OTHER): Payer: BC Managed Care – PPO | Admitting: Nurse Practitioner

## 2021-03-16 ENCOUNTER — Encounter: Payer: Self-pay | Admitting: Nurse Practitioner

## 2021-03-16 VITALS — BP 108/72 | HR 94 | Temp 97.9°F | Ht 67.0 in | Wt 157.0 lb

## 2021-03-16 DIAGNOSIS — R5383 Other fatigue: Secondary | ICD-10-CM | POA: Diagnosis not present

## 2021-03-16 DIAGNOSIS — R61 Generalized hyperhidrosis: Secondary | ICD-10-CM | POA: Diagnosis not present

## 2021-03-16 DIAGNOSIS — E139 Other specified diabetes mellitus without complications: Secondary | ICD-10-CM | POA: Diagnosis not present

## 2021-03-16 DIAGNOSIS — W19XXXA Unspecified fall, initial encounter: Secondary | ICD-10-CM | POA: Diagnosis not present

## 2021-03-16 DIAGNOSIS — R739 Hyperglycemia, unspecified: Secondary | ICD-10-CM

## 2021-03-16 DIAGNOSIS — R590 Localized enlarged lymph nodes: Secondary | ICD-10-CM

## 2021-03-16 DIAGNOSIS — R599 Enlarged lymph nodes, unspecified: Secondary | ICD-10-CM | POA: Diagnosis not present

## 2021-03-16 DIAGNOSIS — L508 Other urticaria: Secondary | ICD-10-CM

## 2021-03-16 DIAGNOSIS — J986 Disorders of diaphragm: Secondary | ICD-10-CM

## 2021-03-16 LAB — POCT URINALYSIS DIP (CLINITEK)
Bilirubin, UA: NEGATIVE
Blood, UA: NEGATIVE
Glucose, UA: NEGATIVE mg/dL
Leukocytes, UA: NEGATIVE
Nitrite, UA: NEGATIVE
Spec Grav, UA: >=1.03 — AB (ref 1.010–1.025)
Urobilinogen, UA: NEGATIVE E.U./dL — AB
pH, UA: 5.5 (ref 5.0–8.0)

## 2021-03-16 NOTE — Progress Notes (Signed)
Acute Office Visit  Subjective:    Patient ID: Albin Felling, female    DOB: 03-20-1981, 40 y.o.   MRN: [id number]  Chief Complaint  Patient presents with  . Swollen axilla lymph nodes    Under Left arm    HPI Patient is in today for left axillary and supraclavicular lymphadenopathy and diaphragm tenderness. Onset of symptoms was approximately 2-weeks ago. She tells me that she did experience a fall from a horse a few weeks ago when she attempted to duck under a tree limb but sat up too quickly and was knocked to the ground by the tree limb. Denies LOC. She did not seek emergency medical attention. She has insulin dependent diabetes that is normally well-controlled. States she has been experiencing elevated blood glucose levels over 200 for the past few days. She denies recent illness or vaccinations. She denies chest pain or dyspnea.  Past Medical History:  Diagnosis Date  . Irritable bowel syndrome with diarrhea   . Type 1 diabetes mellitus without complications (HCC)   . Urticaria     History reviewed. No pertinent surgical history.  Family History  Problem Relation Age of Onset  . Diabetes type I Mother   . High blood pressure Mother   . Cancer Maternal Grandmother     Social History   Socioeconomic History  . Marital status: Single    Spouse name: Not on file  . Number of children: Not on file  . Years of education: Not on file  . Highest education level: Not on file  Occupational History  . 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 Smoking status: Former Smoker    Types: Cigarettes    Quit date: 2017    Years since quitting: 5.4  . Smokeless tobacco: Never Used  . Tobacco comment: Smoked a couple of years, 2 to 3 cigs per day  Vaping Use  . Vaping Use: Never used  Substance and Sexual Activity  . Alcohol use: Yes    Comment: rarely  . Drug use: Never  . Sexual activity: Not Currently  Other Topics Concern  . Not on file  Social History Narrative   . Not on file   Social Determinants of Health   Financial Resource Strain: Not on file  Food Insecurity: Not on file  Transportation Needs: Not on file  Physical Activity: Not on file  Stress: Not on file  Social Connections: Not on file  Intimate Partner Violence: Not on file    Outpatient Medications Prior to Visit  Medication Sig Dispense Refill  . CONTOUR NEXT TEST test strip SMARTSIG:Via Meter 8 Times Daily    . dicyclomine (BENTYL) 10 MG capsule Take 1 capsule (10 mg total) by mouth in the morning and at bedtime. 180 capsule 3  . EPINEPHrine 0.3 mg/0.3 mL IJ SOAJ injection Inject 0.3 mLs (0.3 mg total) into the muscle as needed for anaphylaxis. 2 each 3  . fexofenadine (ALLEGRA) 180 MG tablet TAKE 1 TABLET (180 MG TOTAL) BY MOUTH IN THE MORNING. 30 tablet 5  . Insulin Disposable Pump (OMNIPOD DASH 5 PACK PODS) MISC USE TO ADMINISTER INSULIN. CHANGE EVERY 72 HOURS    . Multiple Vitamin (MULTIVITAMIN) tablet Take 1 tablet by mouth daily.    Marland Kitchen NOVOLOG 100 UNIT/ML injection SMARTSIG:0-60 Unit(s) SUB-Q Daily    . Nutritional Supplements (NUTRITIONAL SUPPLEMENT PO) Take by mouth. Luminex Activate Replenex    . QUEtiapine (SEROQUEL) 50 MG tablet Take 1 tablet (50 mg total)  by mouth at bedtime. 30 tablet 2  . rosuvastatin (CRESTOR) 5 MG tablet Take 5 mg by mouth daily.    Marland Kitchen NOVOLOG 100 UNIT/ML injection Inject into the skin.     Facility-Administered Medications Prior to Visit  Medication Dose Route Frequency Provider Last Rate Last Admin  . omalizumab Geoffry Paradise) injection 300 mg  300 mg Subcutaneous Q28 days Jessica Priest, MD   300 mg at 10/21/20 1407  . omalizumab Geoffry Paradise) prefilled syringe 300 mg  300 mg Subcutaneous Q28 days Nehemiah Settle, FNP   300 mg at 03/10/21 1223    No Known Allergies  Review of Systems  Constitutional: Positive for fatigue. Negative for fever.  HENT: Negative for congestion, ear pain, sinus pressure and sore throat.   Eyes: Negative for pain.   Respiratory: Negative for cough, chest tightness, shortness of breath and wheezing.   Cardiovascular: Negative for chest pain and palpitations.  Gastrointestinal: Negative for abdominal pain, constipation, diarrhea, nausea and vomiting.  Endocrine:       Night sweats, elevated blood sugar   Genitourinary: Negative for dysuria and hematuria.  Musculoskeletal: Positive for myalgias (Joint pain). Negative for arthralgias, back pain and joint swelling.  Skin: Positive for rash (chronic urticaria).  Allergic/Immunologic: Positive for immunocompromised state.  Neurological: Negative for dizziness, weakness and headaches.  Hematological: Positive for adenopathy (left axilla and supraclavicular area).  Psychiatric/Behavioral: Negative for dysphoric mood. The patient is not nervous/anxious.        Objective:    Physical Exam Vitals reviewed.  Constitutional:      Appearance: Normal appearance.  HENT:     Head: Normocephalic.     Right Ear: Tympanic membrane normal.     Left Ear: Tympanic membrane normal.     Nose: Nose normal.     Mouth/Throat:     Mouth: Mucous membranes are moist.  Cardiovascular:     Rate and Rhythm: Normal rate and regular rhythm.     Pulses: Normal pulses.     Heart sounds: Normal heart sounds.  Pulmonary:     Effort: Pulmonary effort is normal.     Breath sounds: Normal breath sounds.  Abdominal:     General: Bowel sounds are normal.     Palpations: Abdomen is soft.     Tenderness: There is no abdominal tenderness.  Musculoskeletal:        General: Normal range of motion.     Left upper arm: Edema and tenderness present.     Cervical back: Neck supple.     Comments: Left axillary lymph nodes edematous and tender to palpation  Lymphadenopathy:     Cervical: Cervical adenopathy present.     Left cervical: Superficial cervical adenopathy present.  Skin:    General: Skin is warm and dry.     Capillary Refill: Capillary refill takes less than 2 seconds.   Neurological:     General: No focal deficit present.     Mental Status: She is alert and oriented to person, place, and time.  Psychiatric:        Mood and Affect: Mood normal.        Behavior: Behavior normal.     BP 108/72 (BP Location: Left Arm, Patient Position: Sitting)   Pulse 94   Temp 97.9 F (36.6 C) (Temporal)   Ht 5\' 7"  (1.702 m)   Wt 157 lb (71.2 kg)   SpO2 98%   BMI 24.59 kg/m  Wt Readings from Last 3 Encounters:  03/16/21 157 lb (71.2  kg)  03/09/21 155 lb (70.3 kg)  09/16/20 154 lb 9.6 oz (70.1 kg)    Health Maintenance Due  Topic Date Due  . Pneumococcal Vaccine 60-37 Years old (1 of 2 - PPSV23) Never done  . FOOT EXAM  Never done  . URINE MICROALBUMIN  Never done  . OPHTHALMOLOGY EXAM  02/08/2021       Lab Results  Component Value Date   TSH 3.580 09/16/2020   Lab Results  Component Value Date   WBC 6.5 09/16/2020   HGB 13.3 09/16/2020   HCT 40.5 09/16/2020   MCV 90 09/16/2020   PLT 363 09/16/2020   Lab Results  Component Value Date   NA 139 09/16/2020   K 5.2 09/16/2020   CO2 21 09/16/2020   GLUCOSE 151 (H) 09/16/2020   BUN 10 09/16/2020   CREATININE 0.66 09/16/2020   BILITOT 0.3 09/16/2020   ALKPHOS 44 09/16/2020   AST 16 09/16/2020   ALT 12 09/16/2020   PROT 6.9 09/16/2020   ALBUMIN 4.5 09/16/2020   CALCIUM 9.6 09/16/2020   Lab Results  Component Value Date   CHOL 162 09/16/2020   Lab Results  Component Value Date   HDL 62 09/16/2020   Lab Results  Component Value Date   LDLCALC 82 09/16/2020   Lab Results  Component Value Date   TRIG 98 09/16/2020   Lab Results  Component Value Date   CHOLHDL 2.6 09/16/2020   Lab Results  Component Value Date   HGBA1C 6.2 (H) 09/16/2020         Assessment & Plan:   1. Axillary lymphadenopathy - CBC with Differential/Platelet - Comprehensive metabolic panel - TSH - B12 and Folate Panel - POCT URINALYSIS DIP (CLINITEK) - DG Chest 2 View  2. Supraclavicular  lymphadenopathy - CBC with Differential/Platelet - Comprehensive metabolic panel - TSH - B12 and Folate Panel - DG Chest 2 View  3. Chronic urticaria - CBC with Differential/Platelet - Comprehensive metabolic panel  4. Night sweats - TSH - DG Chest 2 View  5. Diaphragmitis - CBC with Differential/Platelet - Comprehensive metabolic panel - Hemoglobin A1c - TSH - POCT URINALYSIS DIP (CLINITEK) - DG Chest 2 View  6. Diabetes 1.5, managed as type 1 (HCC) - Hemoglobin A1c  7. Hyperglycemia - Hemoglobin A1c  8. Other fatigue - B12 and Folate Panel - DG Chest 2 View - VITAMIN D 25 Hydroxy (Vit-D Deficiency, Fractures)  9. Fall, initial encounter - CBC with Differential/Platelet - Comprehensive metabolic panel - Hemoglobin A1c - TSH - B12 and Folate Panel - POCT URINALYSIS DIP (CLINITEK) - DG Chest 2 View - VITAMIN D 25 Hydroxy (Vit-D Deficiency, Fractures)    I , Lauren Adalberto Ill as a scribe for [employer], NP.,have documented all relevant documentation on the behalf of Janie Morning, NP,as directed by  Janie Morning, NP while in the presence of Janie Morning, NP.  I, Janie Morning, NP, have reviewed all documentation for this visit. The documentation on 03/16/21 for the exam, diagnosis, procedures, and orders are all accurate and complete.  Continue medications We will call you with lab and chest x-ray results Rest and push fluids   Follow-up: Pending labs and chest x-ray results   Signed, Flonnie Hailstone, DNP

## 2021-03-16 NOTE — Patient Instructions (Addendum)
Continue medications We will call you with lab and chest x-ray results Rest and push fluids Lymphadenopathy  Lymphadenopathy means that your lymph glands are swollen or larger than normal. Lymph glands, also called lymph nodes, are collections of tissue that filter excess fluid, bacteria, viruses, and waste from your bloodstream. They are part of your body's disease-fighting system (immune system), which protects your body from germs. There may be different causes of lymphadenopathy, depending on where it is in your body. Some types go away on their own. Lymphadenopathy can occur anywhere that you have lymph glands, including these areas:  Neck (cervical lymphadenopathy).  Chest (mediastinal lymphadenopathy).  Lungs (hilar lymphadenopathy).  Underarms (axillary lymphadenopathy).  Groin (inguinal lymphadenopathy). When your immune system responds to germs, infection-fighting cells and fluid build up in your lymph glands. This causes some swelling and enlargement. If the lymph nodes do not go back to normal size after you have an infection or disease, your health care provider may do tests. These tests help to monitor your condition and find the reason why the glands are still swollen and enlarged. Follow these instructions at home:  Get plenty of rest.  Your health care provider may recommend over-the-counter medicines for pain. Take over-the-counter and prescription medicines only as told by your health care provider.  If directed, apply heat to swollen lymph glands as often as told by your health care provider. Use the heat source that your health care provider recommends, such as a moist heat pack or a heating pad. ? Place a towel between your skin and the heat source. ? Leave the heat on for 20-30 minutes. ? Remove the heat if your skin turns bright red. This is especially important if you are unable to feel pain, heat, or cold. You may have a greater risk of getting burned.  Check  your affected lymph glands every day for changes. Check other lymph gland areas as told by your health care provider. Check for changes such as: ? More swelling. ? Sudden increase in size. ? Redness or pain. ? Hardness.  Keep all follow-up visits. This is important.   Contact a health care provider if you have:  Lymph glands that: ? Are still swollen after 2 weeks. ? Have suddenly gotten bigger or the swelling spreads. ? Are red, painful, or hard.  Fluid leaking from the skin near an enlarged lymph gland.  Problems with breathing.  A fever, chills, or night sweats.  Fatigue.  A sore throat.  Pain in your abdomen.  Weight loss. Get help right away if you have:  Severe pain.  Chest pain.  Shortness of breath. These symptoms may represent a serious problem that is an emergency. Do not wait to see if the symptoms will go away. Get medical help right away. Call your local emergency services (911 in the [place] Do not drive yourself to the hospital. Summary  Lymphadenopathy means that your lymph glands are swollen or larger than normal.  Lymph glands, also called lymph nodes, are collections of tissue that filter excess fluid, bacteria, viruses, and waste from the bloodstream. They are part of your body's disease-fighting system (immune system).  Lymphadenopathy can occur anywhere that you have lymph glands.  If the lymph nodes do not go back to normal size after you have an infection or disease, your health care provider may do tests to monitor your condition and find the reason why the glands are still swollen and enlarged.  Check your affected lymph glands every day  for changes. Check other lymph gland areas as told by your health care provider. This information is not intended to replace advice given to you by your health care provider. Make sure you discuss any questions you have with your health care provider. Document Revised: 07/21/2020 Document Reviewed:  07/21/2020 Elsevier Patient Education  2021 [employer].

## 2021-03-17 LAB — B12 AND FOLATE PANEL
Folate: >20 ng/mL (ref >3.0)
Vitamin B-12: 842 pg/mL (ref 232–1245)

## 2021-03-17 LAB — CBC WITH DIFFERENTIAL/PLATELET
Basophils Absolute: 0 10*3/uL (ref 0.0–0.2)
Basos: 0 %
EOS (ABSOLUTE): 0.1 10*3/uL (ref 0.0–0.4)
Eos: 2 %
Hematocrit: 36.9 % (ref 34.0–46.6)
Hemoglobin: 12.3 g/dL (ref 11.1–15.9)
Immature Grans (Abs): 0 10*3/uL (ref 0.0–0.1)
Immature Granulocytes: 0 %
Lymphocytes Absolute: 1.8 10*3/uL (ref 0.7–3.1)
Lymphs: 27 %
MCH: 30.4 pg (ref 26.6–33.0)
MCHC: 33.3 g/dL (ref 31.5–35.7)
MCV: 91 fL (ref 79–97)
Monocytes Absolute: 0.7 10*3/uL (ref 0.1–0.9)
Monocytes: 11 %
Neutrophils Absolute: 4 10*3/uL (ref 1.4–7.0)
Neutrophils: 60 %
Platelets: 334 10*3/uL (ref 150–450)
RBC: 4.04 x10E6/uL (ref 3.77–5.28)
RDW: 11.6 % — ABNORMAL LOW (ref 11.7–15.4)
WBC: 6.7 10*3/uL (ref 3.4–10.8)

## 2021-03-17 LAB — TSH: TSH: 2.84 u[IU]/mL (ref 0.450–4.500)

## 2021-03-17 LAB — HEMOGLOBIN A1C
Est. average glucose Bld gHb Est-mCnc: 137 mg/dL
Hgb A1c MFr Bld: 6.4 % — ABNORMAL HIGH (ref 4.8–5.6)

## 2021-03-17 LAB — COMPREHENSIVE METABOLIC PANEL
ALT: 14 IU/L (ref 0–32)
AST: 16 IU/L (ref 0–40)
Albumin/Globulin Ratio: 1.4 (ref 1.2–2.2)
Albumin: 4.2 g/dL (ref 3.8–4.8)
Alkaline Phosphatase: 68 IU/L (ref 44–121)
BUN/Creatinine Ratio: 12 (ref 9–23)
BUN: 9 mg/dL (ref 6–24)
Bilirubin Total: 0.3 mg/dL (ref 0.0–1.2)
CO2: 23 mmol/L (ref 20–29)
Calcium: 9.3 mg/dL (ref 8.7–10.2)
Chloride: 100 mmol/L (ref 96–106)
Creatinine, Ser: 0.73 mg/dL (ref 0.57–1.00)
Globulin, Total: 2.9 g/dL (ref 1.5–4.5)
Glucose: 199 mg/dL — ABNORMAL HIGH (ref 65–99)
Potassium: 4.3 mmol/L (ref 3.5–5.2)
Sodium: 136 mmol/L (ref 134–144)
Total Protein: 7.1 g/dL (ref 6.0–8.5)
eGFR: 107 mL/min/{1.73_m2} (ref >59)

## 2021-03-17 LAB — VITAMIN D 25 HYDROXY (VIT D DEFICIENCY, FRACTURES): Vit D, 25-Hydroxy: 87.5 ng/mL (ref 30.0–100.0)

## 2021-04-03 ENCOUNTER — Other Ambulatory Visit: Payer: Self-pay | Admitting: Physician Assistant

## 2021-04-03 DIAGNOSIS — F5101 Primary insomnia: Secondary | ICD-10-CM

## 2021-04-06 ENCOUNTER — Other Ambulatory Visit: Payer: Self-pay

## 2021-04-06 ENCOUNTER — Ambulatory Visit
Admission: RE | Admit: 2021-04-06 | Discharge: 2021-04-06 | Disposition: A | Discharge status: Home / Self Care | Payer: BC Managed Care – PPO | Source: Ambulatory Visit | Attending: Physician Assistant | Admitting: Physician Assistant

## 2021-04-06 DIAGNOSIS — L501 Idiopathic urticaria: Secondary | ICD-10-CM

## 2021-04-06 DIAGNOSIS — Z1231 Encounter for screening mammogram for malignant neoplasm of breast: Secondary | ICD-10-CM | POA: Diagnosis not present

## 2021-04-07 ENCOUNTER — Ambulatory Visit (INDEPENDENT_AMBULATORY_CARE_PROVIDER_SITE_OTHER): Payer: BC Managed Care – PPO | Admitting: *Deleted

## 2021-04-07 DIAGNOSIS — L501 Idiopathic urticaria: Secondary | ICD-10-CM

## 2021-04-09 ENCOUNTER — Other Ambulatory Visit: Payer: Self-pay | Admitting: Physician Assistant

## 2021-04-09 DIAGNOSIS — R928 Other abnormal and inconclusive findings on diagnostic imaging of breast: Secondary | ICD-10-CM

## 2021-04-12 ENCOUNTER — Encounter: Payer: Self-pay | Admitting: Allergy

## 2021-04-12 ENCOUNTER — Other Ambulatory Visit: Payer: Self-pay

## 2021-04-12 ENCOUNTER — Telehealth: Payer: Self-pay

## 2021-04-12 ENCOUNTER — Other Ambulatory Visit: Payer: Self-pay | Admitting: Physician Assistant

## 2021-04-12 ENCOUNTER — Ambulatory Visit: Payer: BC Managed Care – PPO | Admitting: Allergy

## 2021-04-12 VITALS — BP 118/72 | HR 76 | Resp 16 | Ht 66.7 in | Wt 154.4 lb

## 2021-04-12 DIAGNOSIS — R928 Other abnormal and inconclusive findings on diagnostic imaging of breast: Secondary | ICD-10-CM

## 2021-04-12 DIAGNOSIS — L508 Other urticaria: Secondary | ICD-10-CM

## 2021-04-12 DIAGNOSIS — T783XXD Angioneurotic edema, subsequent encounter: Secondary | ICD-10-CM

## 2021-04-12 NOTE — Progress Notes (Signed)
=    Follow-up Note  RE: Susan Chapman MRN: [id number] DOB: 1981/10/06 Date of Office Visit: 04/12/2021   History of present illness: Susan Chapman is a 40 y.o. female presenting today for follow-up of chronic urticaria with angioedema.  She was last seen in the office on 04/27/2020 by myself.  She states she is done well without any major health changes, surgeries or hospitalizations since last visit.  She has been on Xolair since last July.  She states it took about 5 months or so to really kick in before she really noticed a significant improvement.  She thinks around Thanksgiving is when she started to notice that her hives and swelling was much improved and states it has been "life-changing".  She however is wondering how long she may need to be on Xolair.  At this time she only takes Allegra in the morning.  She was able to discontinue Xyzal and Pepcid use.  We did try Singulair at the last visit but she had sleep disturbance with this and discontinued use.  She does state however if she wears a certain sports bra all day that she may develop pressure induced times that she tries to not to do this and to avoid pressure to the skin that may induce hives.     Review of systems: Review of Systems  Constitutional: Negative.   HENT: Negative.    Eyes: Negative.   Respiratory: Negative.    Cardiovascular: Negative.   Gastrointestinal: Negative.   Musculoskeletal: Negative.   Skin: Negative.   Neurological: Negative.    All other systems negative unless noted above in HPI  Past medical/social/surgical/family history have been reviewed and are unchanged unless specifically indicated below.  No changes  Medication List: Current Outpatient Medications  Medication Sig Dispense Refill   dicyclomine (BENTYL) 10 MG capsule Take 1 capsule (10 mg total) by mouth in the morning and at bedtime. 180 capsule 3   EPINEPHrine 0.3 mg/0.3 mL IJ SOAJ injection Inject 0.3 mLs (0.3 mg total)  into the muscle as needed for anaphylaxis. 2 each 3   fexofenadine (ALLEGRA) 180 MG tablet TAKE 1 TABLET (180 MG TOTAL) BY MOUTH IN THE MORNING. 30 tablet 5   Insulin Disposable Pump (OMNIPOD DASH 5 PACK PODS) MISC USE TO ADMINISTER INSULIN. CHANGE EVERY 72 HOURS     Multiple Vitamin (MULTIVITAMIN) tablet Take 1 tablet by mouth daily.     NOVOLOG 100 UNIT/ML injection SMARTSIG:0-60 Unit(s) SUB-Q Daily     Nutritional Supplements (NUTRITIONAL SUPPLEMENT PO) Take by mouth. Luminex Activate Replenex     QUEtiapine (SEROQUEL) 50 MG tablet TAKE 1 TABLET BY MOUTH EVERYDAY AT BEDTIME 90 tablet 0   rosuvastatin (CRESTOR) 5 MG tablet Take 5 mg by mouth daily.     CONTOUR NEXT TEST test strip SMARTSIG:Via Meter 8 Times Daily     Current Facility-Administered Medications  Medication Dose Route Frequency Provider Last Rate Last Admin   omalizumab Geoffry Paradise) injection 300 mg  300 mg Subcutaneous Q28 days Kozlow, Alvira Philips, MD   300 mg at 10/21/20 1407   omalizumab Geoffry Paradise) prefilled syringe 300 mg  300 mg Subcutaneous Q28 days Nehemiah Settle, FNP   300 mg at 04/07/21 1506     Known medication allergies: No Known Allergies   Physical examination: Blood pressure 118/72, pulse 76, resp. rate 16, height 5' 6.7" (1.694 m), weight 154 lb 6.4 oz (70 kg), last menstrual period 03/23/2021, SpO2 99 %.  General: Alert, interactive, in no acute  distress. HEENT: PERRLA, TMs pearly gray, turbinates non-edematous without discharge, post-pharynx non erythematous. Neck: Supple without lymphadenopathy. Lungs: Clear to auscultation without wheezing, rhonchi or rales. {no increased work of breathing. CV: Normal S1, S2 without murmurs. Abdomen: Nondistended, nontender. Skin: Warm and dry, without lesions or rashes. Extremities:  No clubbing, cyanosis or edema. Neuro:   Grossly intact.  Diagnositics/Labs: None today  Assessment and plan:   Hives and swelling, chronic  - at this time etiology of hives and swelling  is autoimmune in nature.  Hives can be caused by a variety of different triggers including illness/infection, foods, medications, stings, exercise, pressure, vibrations, extremes of temperature to name a few however majority of the time there is no identifiable trigger.   - your labwork revealed that you make an antibody that targets your allergy cells that can cause recurrent episodes of hives/swelling.  This is the autoimmune form of hives. You also makes an antibody against a thyroperoxidase antibody which is a thyroid antibody.  This does not indicate that you have thyroid disease however may be at increased risk of developing thyroid disease in the future. Environmental allergy panel shows moderate IgE to horse; low IgE to dust mites, tree pollens, grass pollens, weed pollens and cockroach.  Recommend continued avoidance as much as possible.   - she has had a good response with use of monthly Xolair started July 2021.  Thus we will attempt a wean as follows: Next dose 150 mg x 3 months.  If she has no increase in symptoms then will reduce to 150 mg every 6 weeks x 2.  If she has no increase in symptoms then will reduce to 150 mg every 8 weeks x 2 and if she has no increase in symptoms then will stop after this.   -Continue daily Allegra 180mg     Follow-up 6-12 months or sooner if needed  I appreciate the opportunity to take part in Susan's care. Please do not hesitate to contact me with questions.  Sincerely,   , MD Allergy/Immunology [facility]

## 2021-04-12 NOTE — Patient Instructions (Addendum)
Hives and swelling, chronic  - at this time etiology of hives and swelling is autoimmune in nature.  Hives can be caused by a variety of different triggers including illness/infection, foods, medications, stings, exercise, pressure, vibrations, extremes of temperature to name a few however majority of the time there is no identifiable trigger.   - your labwork revealed that you make an antibody that targets your allergy cells that can cause recurrent episodes of hives/swelling.  This is the autoimmune form of hives. You also makes an antibody against a thyroperoxidase antibody which is a thyroid antibody.  This does not indicate that you have thyroid disease however may be at increased risk of developing thyroid disease in the future. Environmental allergy panel shows moderate IgE to horse; low IgE to dust mites, tree pollens, grass pollens, weed pollens and cockroach.  Recommend continued avoidance as much as possible.   - she has had a good response with use of monthly Xolair started July 2021.  Thus we will attempt a wean as follows: Next dose 150 mg x 3 months.  If she has no increase in symptoms then will reduce to 150 mg every 6 weeks x 2.  If she has no increase in symptoms then will reduce to 150 mg every 8 weeks x 2 and if she has no increase in symptoms then will stop after this.   -Continue daily Allegra 180mg     Follow-up 6-12 months or sooner if needed

## 2021-04-12 NOTE — Telephone Encounter (Signed)
Per Dr. Delorse Lek, will start decreasing patient's Xolair as follows: 150mg  every month for three months; Then 150mg  every 6 weeks for two doses; Then 150 mg every 8 weeks for two doses; then stop. Only proceed to next dosing schedule if patient is doing well.

## 2021-04-13 DIAGNOSIS — S61011A Laceration without foreign body of right thumb without damage to nail, initial encounter: Secondary | ICD-10-CM | POA: Diagnosis not present

## 2021-04-13 DIAGNOSIS — Z23 Encounter for immunization: Secondary | ICD-10-CM | POA: Diagnosis not present

## 2021-04-13 NOTE — Telephone Encounter (Signed)
Started re-approval for Xolair and will be reach ing out to patient to explain change to specialty pharmacy from [facility] due to [employer]

## 2021-04-25 NOTE — Telephone Encounter (Signed)
Called patient and advised of change from buy and bill to [facility] pharmacy to dispense due to [employer]

## 2021-04-29 ENCOUNTER — Other Ambulatory Visit: Payer: Self-pay | Admitting: Physician Assistant

## 2021-04-29 ENCOUNTER — Ambulatory Visit
Admission: RE | Admit: 2021-04-29 | Discharge: 2021-04-29 | Disposition: A | Discharge status: Home / Self Care | Payer: BC Managed Care – PPO | Source: Ambulatory Visit | Attending: Physician Assistant | Admitting: Physician Assistant

## 2021-04-29 ENCOUNTER — Other Ambulatory Visit: Payer: Self-pay

## 2021-04-29 DIAGNOSIS — R922 Inconclusive mammogram: Secondary | ICD-10-CM | POA: Diagnosis not present

## 2021-04-29 DIAGNOSIS — R921 Mammographic calcification found on diagnostic imaging of breast: Secondary | ICD-10-CM

## 2021-04-29 DIAGNOSIS — R928 Other abnormal and inconclusive findings on diagnostic imaging of breast: Secondary | ICD-10-CM

## 2021-05-05 ENCOUNTER — Ambulatory Visit: Payer: BC Managed Care – PPO

## 2021-05-10 ENCOUNTER — Ambulatory Visit (INDEPENDENT_AMBULATORY_CARE_PROVIDER_SITE_OTHER): Payer: BC Managed Care – PPO

## 2021-05-10 DIAGNOSIS — Z9641 Presence of insulin pump (external) (internal): Secondary | ICD-10-CM | POA: Diagnosis not present

## 2021-05-10 DIAGNOSIS — L501 Idiopathic urticaria: Secondary | ICD-10-CM | POA: Diagnosis not present

## 2021-05-10 DIAGNOSIS — Z794 Long term (current) use of insulin: Secondary | ICD-10-CM | POA: Diagnosis not present

## 2021-05-10 DIAGNOSIS — E109 Type 1 diabetes mellitus without complications: Secondary | ICD-10-CM | POA: Diagnosis not present

## 2021-05-10 DIAGNOSIS — Z833 Family history of diabetes mellitus: Secondary | ICD-10-CM | POA: Diagnosis not present

## 2021-05-10 MED ORDER — OMALIZUMAB 150 MG/ML ~~LOC~~ SOSY
150.0000 mg | PREFILLED_SYRINGE | SUBCUTANEOUS | Status: DC
  Administered 2021-05-10 – 2022-05-09 (×11): 150 mg via SUBCUTANEOUS

## 2021-06-06 DIAGNOSIS — L501 Idiopathic urticaria: Secondary | ICD-10-CM | POA: Diagnosis not present

## 2021-06-07 ENCOUNTER — Ambulatory Visit (INDEPENDENT_AMBULATORY_CARE_PROVIDER_SITE_OTHER): Payer: BC Managed Care – PPO | Admitting: *Deleted

## 2021-06-07 ENCOUNTER — Other Ambulatory Visit: Payer: Self-pay

## 2021-06-07 DIAGNOSIS — L501 Idiopathic urticaria: Secondary | ICD-10-CM | POA: Diagnosis not present

## 2021-06-30 ENCOUNTER — Other Ambulatory Visit: Payer: Self-pay | Admitting: Physician Assistant

## 2021-06-30 DIAGNOSIS — F5101 Primary insomnia: Secondary | ICD-10-CM

## 2021-07-05 ENCOUNTER — Other Ambulatory Visit: Payer: Self-pay

## 2021-07-05 ENCOUNTER — Ambulatory Visit (INDEPENDENT_AMBULATORY_CARE_PROVIDER_SITE_OTHER): Payer: BC Managed Care – PPO

## 2021-07-05 DIAGNOSIS — L501 Idiopathic urticaria: Secondary | ICD-10-CM

## 2021-08-02 ENCOUNTER — Ambulatory Visit (INDEPENDENT_AMBULATORY_CARE_PROVIDER_SITE_OTHER): Payer: BC Managed Care – PPO | Admitting: *Deleted

## 2021-08-02 ENCOUNTER — Other Ambulatory Visit: Payer: Self-pay

## 2021-08-02 DIAGNOSIS — L501 Idiopathic urticaria: Secondary | ICD-10-CM | POA: Diagnosis not present

## 2021-08-30 ENCOUNTER — Other Ambulatory Visit: Payer: Self-pay

## 2021-08-30 ENCOUNTER — Ambulatory Visit (INDEPENDENT_AMBULATORY_CARE_PROVIDER_SITE_OTHER): Payer: BC Managed Care – PPO | Admitting: *Deleted

## 2021-08-30 DIAGNOSIS — L501 Idiopathic urticaria: Secondary | ICD-10-CM | POA: Diagnosis not present

## 2021-09-13 DIAGNOSIS — Z9641 Presence of insulin pump (external) (internal): Secondary | ICD-10-CM | POA: Diagnosis not present

## 2021-09-13 DIAGNOSIS — E109 Type 1 diabetes mellitus without complications: Secondary | ICD-10-CM | POA: Diagnosis not present

## 2021-09-21 ENCOUNTER — Encounter: Payer: Self-pay | Admitting: Physician Assistant

## 2021-09-21 ENCOUNTER — Ambulatory Visit (INDEPENDENT_AMBULATORY_CARE_PROVIDER_SITE_OTHER): Payer: BC Managed Care – PPO | Admitting: Physician Assistant

## 2021-09-21 ENCOUNTER — Other Ambulatory Visit: Payer: Self-pay

## 2021-09-21 VITALS — BP 108/62 | HR 81 | Temp 97.2°F | Ht 66.7 in | Wt 160.0 lb

## 2021-09-21 DIAGNOSIS — Z0000 Encounter for general adult medical examination without abnormal findings: Secondary | ICD-10-CM

## 2021-09-21 NOTE — Progress Notes (Signed)
Subjective:  Patient ID: Susan Chapman, female    DOB: Feb 12, 1981  Age: 40 y.o. MRN: [id number]  Chief Complaint  Patient presents with   Annual Exam    HPI Well Adult Physical: Patient here for a comprehensive physical exam.The patient reports  has had some soreness in right elbow with working out ---- would like hormone levels checked to see if she is perimenopausal Do you take any herbs or supplements that were not prescribed by a doctor? yes Are you taking calcium supplements? no Are you taking aspirin daily? no  Encounter for general adult medical examination without abnormal findings  Physical ("At Risk" items are starred): Patient's last physical exam was 1 year ago .  Patient is not afflicted from Stress Incontinence and Urge Incontinence  Patient wears a seat belt, has smoke detectors, has carbon monoxide detectors, practices appropriate gun safety, and wears sunscreen with extended sun exposure. Dental Care: biannual cleanings, brushes and flosses daily. Ophthalmology/Optometry: Annual visit.  Hearing loss: none Vision impairments: none  Pt states she is still having periods  Last pap was 08/02/2018 Pt does follow with endocrinologist for diabetes - just had labwork done that included cbc, cmp , tsh and hgb a1c  Flowsheet Row Office Visit from 09/21/2021 in [facility]  PHQ-2 Total Score 0               Social Hx   Social History   Socioeconomic History   Marital status: Single    Spouse name: Not on file   Number of children: Not on file   Years of education: Not on file   Highest education level: Not on file  Occupational History   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[employer]  Tobacco Use   Smoking status: Former    Types: Cigarettes    Quit date: 2017    Years since quitting: 5.9   Smokeless tobacco: Never   Tobacco comments:    Smoked a couple of years, 2 to 3 cigs per day  Vaping Use   Vaping Use: Never used  Substance and Sexual Activity   Alcohol  use: Yes    Comment: rarely   Drug use: Never   Sexual activity: Not Currently  Other Topics Concern   Not on file  Social History Narrative   Not on file   Social Determinants of Health   Financial Resource Strain: Not on file  Food Insecurity: Not on file  Transportation Needs: Not on file  Physical Activity: Not on file  Stress: Not on file  Social Connections: Not on file   Past Medical History:  Diagnosis Date   Irritable bowel syndrome with diarrhea    Type 1 diabetes mellitus without complications (HCC)    Urticaria    History reviewed. No pertinent surgical history.  Family History  Problem Relation Age of Onset   Diabetes type I Mother    High blood pressure Mother    Cancer Maternal Grandmother    Breast cancer Neg Hx     Review of Systems CONSTITUTIONAL: Negative for chills, fatigue, fever, unintentional weight gain and unintentional weight loss.  E/N/T: Negative for ear pain, nasal congestion and sore throat.  CARDIOVASCULAR: Negative for chest pain, dizziness, palpitations and pedal edema.  RESPIRATORY: Negative for recent cough and dyspnea.  GASTROINTESTINAL: Negative for abdominal pain, acid reflux symptoms, constipation, diarrhea, nausea and vomiting.  MSK:see HPI INTEGUMENTARY: Negative for rash.  NEUROLOGICAL: Negative for dizziness and headaches.  PSYCHIATRIC: Negative for sleep disturbance and to  question depression screen.  Negative for depression, negative for anhedonia.       Objective:  BP 108/62 (BP Location: Left Arm, Patient Position: Sitting, Cuff Size: Normal)    Pulse 81    Temp (!) 97.2 F (36.2 C) (Temporal)    Ht 5' 6.7" (1.694 m)    Wt 160 lb (72.6 kg)    SpO2 97%    BMI 25.29 kg/m   BP/Weight 09/21/2021 04/12/2021 03/16/2021  Systolic BP 108 118 108  Diastolic BP 62 72 72  Wt. (Lbs) 160 154.4 157  BMI 25.29 24.4 24.59    Physical Exam PHYSICAL EXAM:   VS: BP 108/62 (BP Location: Left Arm, Patient Position: Sitting, Cuff Size:  Normal)    Pulse 81    Temp (!) 97.2 F (36.2 C) (Temporal)    Ht 5' 6.7" (1.694 m)    Wt 160 lb (72.6 kg)    SpO2 97%    BMI 25.29 kg/m   GEN: Well nourished, well developed, in no acute distress  HEENT: normal external ears and nose - normal external auditory canals and TMS - hearing grossly normal - normal nasal mucosa and septum - Lips, Teeth and Gums - normal  Oropharynx - normal mucosa, palate, and posterior pharynx Cardiac: RRR; no murmurs, rubs, or gallops,no edema - Respiratory:  normal respiratory rate and pattern with no distress - normal breath sounds with no rales, rhonchi, wheezes or rubs GI: normal bowel sounds, no masses or tenderness MS: no deformity or atrophy  Skin: warm and dry, no rash  Neuro:  Alert and Oriented x 3, Strength and sensation are intact - CN II-Xii grossly intact Psych: euthymic mood, appropriate affect and demeanor  Lab Results  Component Value Date   WBC 6.7 03/16/2021   HGB 12.3 03/16/2021   HCT 36.9 03/16/2021   PLT 334 03/16/2021   GLUCOSE 199 (H) 03/16/2021   CHOL 162 09/16/2020   TRIG 98 09/16/2020   HDL 62 09/16/2020   LDLCALC 82 09/16/2020   ALT 14 03/16/2021   AST 16 03/16/2021   NA 136 03/16/2021   K 4.3 03/16/2021   CL 100 03/16/2021   CREATININE 0.73 03/16/2021   BUN 9 03/16/2021   CO2 23 03/16/2021   TSH 2.840 03/16/2021   HGBA1C 6.4 (H) 03/16/2021      Assessment & Plan:   Problem List Items Addressed This Visit       Other   Routine physical examination - Primary   Relevant Orders   Pneumococcal conjugate vaccine 20-valent (Prevnar 20)   FSH/LH   Lipid panel   VITAMIN D 25 Hydroxy (Vit-D Deficiency, Fractures)      Body mass index is 25.29 kg/m.   These are the goals we discussed:  Goals   None      This is a list of the screening recommended for you and due dates:  Health Maintenance  Topic Date Due   Pneumococcal Vaccination (1 - PCV) Never done   Tetanus Vaccine  Never done   Pap Smear   07/29/2021   Hemoglobin A1C  09/15/2021   Eye exam for diabetics  09/21/2021*   Flu Shot  01/06/2022*   Mammogram  04/29/2022   HPV Vaccine  Aged Out   Complete foot exam   Discontinued   Urine Protein Check  Discontinued   COVID-19 Vaccine  Discontinued   Hepatitis C Screening: USPSTF Recommendation to screen - Ages 66-79 yo.  Discontinued   HIV Screening  Discontinued  *  Topic was postponed. The date shown is not the original due date.     No orders of the defined types were placed in this encounter.   Follow-up: Return in about 1 year (around 09/21/2022) for fasting wellness.  An After Visit Summary was printed and given to the patient.  Jettie Pagan [facility] [phone]

## 2021-09-22 LAB — LIPID PANEL
Chol/HDL Ratio: 2.6 ratio (ref 0.0–4.4)
Cholesterol, Total: 156 mg/dL (ref 100–199)
HDL: 61 mg/dL (ref >39)
LDL Chol Calc (NIH): 78 mg/dL (ref 0–99)
Triglycerides: 92 mg/dL (ref 0–149)
VLDL Cholesterol Cal: 17 mg/dL (ref 5–40)

## 2021-09-22 LAB — VITAMIN D 25 HYDROXY (VIT D DEFICIENCY, FRACTURES): Vit D, 25-Hydroxy: 57.7 ng/mL (ref 30.0–100.0)

## 2021-09-22 LAB — CARDIOVASCULAR RISK ASSESSMENT

## 2021-09-22 LAB — FSH/LH
FSH: 4.7 m[IU]/mL
LH: 6.4 m[IU]/mL

## 2021-09-23 ENCOUNTER — Other Ambulatory Visit: Payer: Self-pay | Admitting: Physician Assistant

## 2021-09-23 DIAGNOSIS — F5101 Primary insomnia: Secondary | ICD-10-CM

## 2021-09-27 ENCOUNTER — Other Ambulatory Visit: Payer: Self-pay

## 2021-09-27 ENCOUNTER — Ambulatory Visit (INDEPENDENT_AMBULATORY_CARE_PROVIDER_SITE_OTHER): Payer: BC Managed Care – PPO

## 2021-09-27 DIAGNOSIS — L501 Idiopathic urticaria: Secondary | ICD-10-CM | POA: Diagnosis not present

## 2021-10-25 ENCOUNTER — Other Ambulatory Visit: Payer: Self-pay

## 2021-10-25 ENCOUNTER — Ambulatory Visit (INDEPENDENT_AMBULATORY_CARE_PROVIDER_SITE_OTHER): Payer: BC Managed Care – PPO

## 2021-10-25 DIAGNOSIS — L501 Idiopathic urticaria: Secondary | ICD-10-CM | POA: Diagnosis not present

## 2021-11-01 ENCOUNTER — Other Ambulatory Visit: Payer: Self-pay

## 2021-11-01 ENCOUNTER — Ambulatory Visit
Admission: RE | Admit: 2021-11-01 | Discharge: 2021-11-01 | Disposition: A | Discharge status: Home / Self Care | Payer: BC Managed Care – PPO | Source: Ambulatory Visit | Attending: Physician Assistant | Admitting: Physician Assistant

## 2021-11-01 ENCOUNTER — Other Ambulatory Visit: Payer: Self-pay | Admitting: Physician Assistant

## 2021-11-01 DIAGNOSIS — R922 Inconclusive mammogram: Secondary | ICD-10-CM | POA: Diagnosis not present

## 2021-11-01 DIAGNOSIS — R921 Mammographic calcification found on diagnostic imaging of breast: Secondary | ICD-10-CM

## 2021-11-22 ENCOUNTER — Ambulatory Visit: Payer: BC Managed Care – PPO

## 2021-11-22 ENCOUNTER — Other Ambulatory Visit: Payer: Self-pay | Admitting: *Deleted

## 2021-11-22 MED ORDER — OMALIZUMAB 150 MG/ML ~~LOC~~ SOSY: 150.0000 mg | PREFILLED_SYRINGE | SUBCUTANEOUS | 11 refills | Status: DC

## 2021-12-06 ENCOUNTER — Other Ambulatory Visit: Payer: Self-pay

## 2021-12-06 ENCOUNTER — Ambulatory Visit (INDEPENDENT_AMBULATORY_CARE_PROVIDER_SITE_OTHER): Payer: BC Managed Care – PPO | Admitting: *Deleted

## 2021-12-06 DIAGNOSIS — L501 Idiopathic urticaria: Secondary | ICD-10-CM | POA: Diagnosis not present

## 2021-12-19 ENCOUNTER — Other Ambulatory Visit: Payer: Self-pay | Admitting: Physician Assistant

## 2021-12-19 DIAGNOSIS — F5101 Primary insomnia: Secondary | ICD-10-CM

## 2022-01-01 ENCOUNTER — Encounter: Payer: Self-pay | Admitting: Allergy

## 2022-01-02 ENCOUNTER — Other Ambulatory Visit: Payer: Self-pay | Admitting: *Deleted

## 2022-01-02 MED ORDER — OMALIZUMAB 150 MG/ML ~~LOC~~ SOSY: 150.0000 mg | PREFILLED_SYRINGE | SUBCUTANEOUS | 11 refills | Status: DC

## 2022-01-03 ENCOUNTER — Ambulatory Visit: Payer: BC Managed Care – PPO

## 2022-01-10 DIAGNOSIS — Z9641 Presence of insulin pump (external) (internal): Secondary | ICD-10-CM | POA: Diagnosis not present

## 2022-01-10 DIAGNOSIS — E109 Type 1 diabetes mellitus without complications: Secondary | ICD-10-CM | POA: Diagnosis not present

## 2022-01-17 ENCOUNTER — Ambulatory Visit (INDEPENDENT_AMBULATORY_CARE_PROVIDER_SITE_OTHER): Payer: BC Managed Care – PPO | Admitting: *Deleted

## 2022-01-17 DIAGNOSIS — L501 Idiopathic urticaria: Secondary | ICD-10-CM

## 2022-02-20 DIAGNOSIS — E109 Type 1 diabetes mellitus without complications: Secondary | ICD-10-CM | POA: Diagnosis not present

## 2022-03-07 ENCOUNTER — Encounter: Payer: Self-pay | Admitting: Allergy

## 2022-03-14 ENCOUNTER — Ambulatory Visit (INDEPENDENT_AMBULATORY_CARE_PROVIDER_SITE_OTHER): Payer: BC Managed Care – PPO | Admitting: *Deleted

## 2022-03-14 DIAGNOSIS — L501 Idiopathic urticaria: Secondary | ICD-10-CM

## 2022-03-17 ENCOUNTER — Other Ambulatory Visit: Payer: Self-pay | Admitting: Physician Assistant

## 2022-03-17 DIAGNOSIS — F5101 Primary insomnia: Secondary | ICD-10-CM

## 2022-04-09 ENCOUNTER — Encounter: Payer: Self-pay | Admitting: Allergy

## 2022-05-02 ENCOUNTER — Ambulatory Visit
Admission: RE | Admit: 2022-05-02 | Discharge: 2022-05-02 | Disposition: A | Discharge status: Home / Self Care | Payer: BC Managed Care – PPO | Source: Ambulatory Visit | Attending: Physician Assistant | Admitting: Physician Assistant

## 2022-05-02 DIAGNOSIS — R921 Mammographic calcification found on diagnostic imaging of breast: Secondary | ICD-10-CM | POA: Diagnosis not present

## 2022-05-09 ENCOUNTER — Ambulatory Visit (INDEPENDENT_AMBULATORY_CARE_PROVIDER_SITE_OTHER): Payer: BC Managed Care – PPO | Admitting: *Deleted

## 2022-05-09 DIAGNOSIS — L501 Idiopathic urticaria: Secondary | ICD-10-CM | POA: Diagnosis not present

## 2022-05-12 DIAGNOSIS — E109 Type 1 diabetes mellitus without complications: Secondary | ICD-10-CM | POA: Diagnosis not present

## 2022-05-12 DIAGNOSIS — Z9641 Presence of insulin pump (external) (internal): Secondary | ICD-10-CM | POA: Diagnosis not present

## 2022-05-12 LAB — HEMOGLOBIN A1C: Hemoglobin A1C: 6.9

## 2022-06-11 ENCOUNTER — Other Ambulatory Visit: Payer: Self-pay | Admitting: Physician Assistant

## 2022-06-11 DIAGNOSIS — F5101 Primary insomnia: Secondary | ICD-10-CM

## 2022-06-22 ENCOUNTER — Telehealth: Payer: Self-pay | Admitting: *Deleted

## 2022-06-22 NOTE — Patient Outreach (Signed)
  Care Coordination   Initial Visit Note   06/22/2022 Name: Susan Chapman MRN: [id number] DOB: 04/13/1981  Susan Chapman is a 41 y.o. year old female who sees Marianne Sofia, [place] for primary care. I spoke with  Susan Chapman by phone today.  What matters to the patients health and wellness today?  Management of DM, A1C and other health needs. Care Coordination intake scheduled with [facility] 06/29/22   Goals Addressed   None     SDOH assessments and interventions completed:  No     Care Coordination Interventions Activated:  No  Care Coordination Interventions:  No, not indicated   Follow up plan:  RNCM for intake 06/29/22    Encounter Outcome:  Pt. Scheduled   Reece Levy MSW, LCSW Licensed Clinical Social Worker      [phone]

## 2022-06-26 ENCOUNTER — Ambulatory Visit (INDEPENDENT_AMBULATORY_CARE_PROVIDER_SITE_OTHER): Payer: BC Managed Care – PPO | Admitting: Physician Assistant

## 2022-06-26 ENCOUNTER — Encounter: Payer: Self-pay | Admitting: Physician Assistant

## 2022-06-26 VITALS — BP 110/66 | HR 97 | Temp 97.7°F | Ht 66.75 in | Wt 162.2 lb

## 2022-06-26 DIAGNOSIS — E109 Type 1 diabetes mellitus without complications: Secondary | ICD-10-CM | POA: Diagnosis not present

## 2022-06-26 DIAGNOSIS — F5101 Primary insomnia: Secondary | ICD-10-CM

## 2022-06-26 MED ORDER — QUETIAPINE FUMARATE 50 MG PO TABS: ORAL_TABLET | ORAL | 1 refills | Status: DC

## 2022-06-26 NOTE — Progress Notes (Signed)
Acute Office Visit  Subjective:    Patient ID: Susan Chapman, female    DOB: 1980/11/04, 41 y.o.   MRN: [id number]  Chief Complaint  Patient presents with   Insomnia    Insomnia   Patient is in today for insomnia - pt is in for refill of seroquel 58m qhs - she states this medication is working very well for her  Pt is diabetic and follows regularly with Dr STamala Julian- states her last a1c was in august and was 6.9 -  she is due for labwork and follow up with him She will have fasting follow up with our office in December and defers labwork until that time  Past Medical History:  Diagnosis Date   Irritable bowel syndrome with diarrhea    [place]    Urticaria     History reviewed. No pertinent surgical history.  Family History  Problem Relation Age of Onset   Diabetes type I Mother    High blood pressure Mother    Cancer Maternal Grandmother    Breast cancer Neg Hx     Social History   Socioeconomic History   Marital status: Single    Spouse name: Not on file   Number of children: Not on file   Years of education: Not on file   Highest education level: Not on file  Occupational History   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[employer]  Tobacco Use   Smoking status: Former    Types: Cigarettes    Quit date: 2017    Years since quitting: 6.7   Smokeless tobacco: Never   Tobacco comments:    Smoked a couple of years, 2 to 3 cigs per day  Vaping Use   Vaping Use: Never used  Substance and Sexual Activity   Alcohol use: Yes    Comment: rarely   Drug use: Never   Sexual activity: Not Currently  Other Topics Concern   Not on file  Social History Narrative   Not on file   Social Determinants of Health   Financial Resource Strain: Not on file  Food Insecurity: Not on file  Transportation Needs: Not on file  Physical Activity: Not on file  Stress: Not on file  Social Connections: Not on file  Intimate Partner Violence: Not on  file    Outpatient Medications Prior to Visit  Medication Sig Dispense Refill   CONTOUR NEXT TEST test strip SMARTSIG:Via Meter 8 Times Daily     dicyclomine (BENTYL) 10 MG capsule TAKE 1 CAPSULE (10 MG TOTAL) BY MOUTH IN THE MORNING AND AT BEDTIME. 180 capsule 3   EPINEPHrine 0.3 mg/0.3 mL IJ SOAJ injection Inject 0.3 mLs (0.3 mg total) into the muscle as needed for anaphylaxis. 2 each 3   Insulin Disposable Pump (OMNIPOD DASH 5 PACK PODS) MISC USE TO ADMINISTER INSULIN. CHANGE EVERY 72 HOURS     Multiple Vitamin (MULTIVITAMIN) tablet Take 1 tablet by mouth daily.     NOVOLOG 100 UNIT/ML injection SMARTSIG:0-60 Unit(s) SUB-Q Daily     rosuvastatin (CRESTOR) 5 MG tablet Take 5 mg by mouth daily.     fexofenadine (ALLEGRA) 180 MG tablet TAKE 1 TABLET (180 MG TOTAL) BY MOUTH IN THE MORNING. 30 tablet 5   Nutritional Supplements (NUTRITIONAL SUPPLEMENT PO) Take by mouth. Luminex Activate Replenex     omalizumab (XOLAIR) 150 MG/ML prefilled syringe Inject 150 mg into the skin every 28 (twenty-eight) days. 1 mL 11   QUEtiapine (SEROQUEL) 50  MG tablet TAKE 1 TABLET BY MOUTH EVERYDAY AT BEDTIME 90 tablet 0   omalizumab (XOLAIR) prefilled syringe 150 mg      No facility-administered medications prior to visit.    No Known Allergies  CONSTITUTIONAL: Negative for chills, fatigue, fever, unintentional weight gain and unintentional weight loss.   CARDIOVASCULAR: Negative for chest pain, dizziness, palpitations and pedal edema.  RESPIRATORY: Negative for recent cough and dyspnea.   MSK: Negative for arthralgias and myalgias.  INTEGUMENTARY: Negative for rash.  NEUROLOGICAL: see HPI .         Objective:  PHYSICAL EXAM:   VS: BP 110/66 (BP Location: Left Arm, Patient Position: Sitting, Cuff Size: Normal)   Pulse 97   Temp 97.7 F (36.5 C) (Temporal)   Ht 5' 6.75" (1.695 m)   Wt 162 lb 3.2 oz (73.6 kg)   SpO2 100%   BMI 25.59 kg/m   GEN: Well nourished, well developed, in no acute  distress   Cardiac: RRR; no murmurs, rubs,  Respiratory:  normal respiratory rate and pattern with no distress - normal breath sounds with no rales, rhonchi, wheezes or rubs  Psych: euthymic mood, appropriate affect and demeanor    Health Maintenance Due  Topic Date Due   HEMOGLOBIN A1C  09/15/2021   Diabetic kidney evaluation - GFR measurement  03/16/2022    There are no preventive care reminders to display for this patient.   Lab Results  Component Value Date   TSH 2.840 03/16/2021   Lab Results  Component Value Date   WBC 6.7 03/16/2021   HGB 12.3 03/16/2021   HCT 36.9 03/16/2021   MCV 91 03/16/2021   PLT 334 03/16/2021   Lab Results  Component Value Date   NA 136 03/16/2021   K 4.3 03/16/2021   CO2 23 03/16/2021   GLUCOSE 199 (H) 03/16/2021   BUN 9 03/16/2021   CREATININE 0.73 03/16/2021   BILITOT 0.3 03/16/2021   ALKPHOS 68 03/16/2021   AST 16 03/16/2021   ALT 14 03/16/2021   PROT 7.1 03/16/2021   ALBUMIN 4.2 03/16/2021   CALCIUM 9.3 03/16/2021   EGFR 107 03/16/2021   Lab Results  Component Value Date   CHOL 156 09/21/2021   Lab Results  Component Value Date   HDL 61 09/21/2021   Lab Results  Component Value Date   LDLCALC 78 09/21/2021   Lab Results  Component Value Date   TRIG 92 09/21/2021   Lab Results  Component Value Date   CHOLHDL 2.6 09/21/2021   Lab Results  Component Value Date   HGBA1C 6.4 (H) 03/16/2021       Assessment & Plan:  1. Primary insomnia - QUEtiapine (SEROQUEL) 50 MG tablet; TAKE 1 TABLET BY MOUTH EVERYDAY AT BEDTIME  Dispense: 90 tablet; Refill: 1  2. Type 1 diabetes mellitus without complication ([place])  Continue follow up with endocrinologist  Meds ordered this encounter  Medications   QUEtiapine (SEROQUEL) 50 MG tablet    Sig: TAKE 1 TABLET BY MOUTH EVERYDAY AT BEDTIME    Dispense:  90 tablet    Refill:  1    Order Specific Question:   Supervising Provider    Answer:   Shelton Silvas    No  orders of the defined types were placed in this encounter.     Follow-up: Return for in December for fasting physical.  An After Visit Summary was printed and given to the patient.  Webb Silversmith, PA-C [facility] (  [phone]

## 2022-06-29 ENCOUNTER — Ambulatory Visit: Payer: Self-pay

## 2022-06-29 NOTE — Patient Outreach (Signed)
  Care Coordination   Initial Visit Note   06/29/2022 Name: Susan Chapman MRN: [id number] DOB: 1981-06-26  Susan Chapman is a 41 y.o. year old female who sees Marge Duncans, [place] for primary care. I spoke with  Shiela Mayer by phone today.  What matters to the patients health and wellness today?  Higher than normal A1c for patient    6.0-6.9    Goals Addressed               This Visit's Progress     " I want my A1c to be lower" (pt-stated)        Care Coordination Interventions: Reviewed medications with patient and discussed importance of medication adherence Counseled on importance of regular laboratory monitoring as prescribed Discussed plans with patient for ongoing care management follow up and provided patient with direct contact information for care management team Reviewed scheduled/upcoming provider appointments including: PCP and endocrinologist Review of patient status, including review of consultants reports, relevant laboratory and other test results, and medications completed Reviewed with patient to discuss the stoppage of her medication for her allergies and determine if this could be the cause of increased CBG . Reviewed with patient that she has no s/s of infection Reviewed patients goals for her own A1c            SDOH assessments and interventions completed:  Yes  SDOH Interventions Today    Flowsheet Row Most Recent Value  SDOH Interventions   Food Insecurity Interventions Intervention Not Indicated  Housing Interventions Intervention Not Indicated  Transportation Interventions Intervention Not Indicated  Utilities Interventions Intervention Not Indicated        Care Coordination Interventions Activated:  Yes  Care Coordination Interventions:  Yes, provided   Follow up plan: Follow up call scheduled for 07/27/2022    Encounter Outcome:  Pt. Visit Completed   Tomasa Rand, RN, BSN, CEN [place]  Coordinator [phone]

## 2022-08-30 ENCOUNTER — Encounter: Payer: Self-pay | Admitting: Physician Assistant

## 2022-09-15 ENCOUNTER — Other Ambulatory Visit: Payer: Self-pay | Admitting: Physician Assistant

## 2022-09-21 DIAGNOSIS — E109 Type 1 diabetes mellitus without complications: Secondary | ICD-10-CM | POA: Diagnosis not present

## 2022-09-21 DIAGNOSIS — Z9641 Presence of insulin pump (external) (internal): Secondary | ICD-10-CM | POA: Diagnosis not present

## 2022-09-21 LAB — HEMOGLOBIN A1C: Hemoglobin A1C: 5.5

## 2022-09-22 ENCOUNTER — Ambulatory Visit (INDEPENDENT_AMBULATORY_CARE_PROVIDER_SITE_OTHER): Payer: BC Managed Care – PPO | Admitting: Physician Assistant

## 2022-09-22 ENCOUNTER — Encounter: Payer: Self-pay | Admitting: Physician Assistant

## 2022-09-22 VITALS — BP 102/80 | HR 70 | Temp 98.2°F | Resp 14 | Ht 67.0 in | Wt 159.0 lb

## 2022-09-22 DIAGNOSIS — Z0000 Encounter for general adult medical examination without abnormal findings: Secondary | ICD-10-CM

## 2022-09-22 DIAGNOSIS — E109 Type 1 diabetes mellitus without complications: Secondary | ICD-10-CM

## 2022-09-22 LAB — POCT URINALYSIS DIP (CLINITEK)
Bilirubin, UA: NEGATIVE
Glucose, UA: NEGATIVE mg/dL
Ketones, POC UA: NEGATIVE mg/dL
Leukocytes, UA: NEGATIVE
Nitrite, UA: NEGATIVE
POC PROTEIN,UA: NEGATIVE
Spec Grav, UA: 1.025 (ref 1.010–1.025)
Urobilinogen, UA: 0.2 E.U./dL
pH, UA: 5 (ref 5.0–8.0)

## 2022-09-22 MED ORDER — GVOKE HYPOPEN 2-PACK 0.5 MG/0.1ML ~~LOC~~ SOAJ: 1.0000 | SUBCUTANEOUS | 2 refills | Status: AC | PRN

## 2022-09-22 NOTE — Progress Notes (Signed)
Subjective:  Patient ID: Susan Chapman, female    DOB: [birth year]  Age: 41 y.o. MRN: [id number]  Chief Complaint  Patient presents with   Annual Exam    HPI Well Adult Physical: Patient here for a comprehensive physical exam.The patient reports  she just saw endocrinologist yesterday and states hgb A1c was 5.5 ---- however she states she is starting to use Dexcom because she has woken up with glucose in 40s - recommend gvoke pen and will send rx  ---- also pt has not had microalbumin done - will send that as well Do you take any herbs or supplements that were not prescribed by a doctor? no Are you taking calcium supplements? no Are you taking aspirin daily? no  Encounter for general adult medical examination without abnormal findings  Physical ("At Risk" items are starred): Patient's last physical exam was 1 year ago .  Patient is not afflicted from Stress Incontinence and Urge Incontinence  Patient wears a seat belt, has smoke detectors, has carbon monoxide detectors, practices appropriate gun safety, and wears sunscreen with extended sun exposure. Dental Care: biannual cleanings, brushes and flosses daily. Ophthalmology/Optometry: Annual visit.  Hearing loss: none Vision impairments: none   LMP: current - started yesterday Safe at home: yes Self breast exams: yes  Flowsheet Row Office Visit from 09/22/2022 in [facility]  PHQ-2 Total Score 0               Social Hx   Social History   Socioeconomic History   Marital status: Single    Spouse name: Not on file   Number of children: Not on file   Years of education: Not on file   Highest education level: Not on file  Occupational History   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[employer]  Tobacco Use   Smoking status: Former    Types: Cigarettes    Quit date: 2017    Years since quitting: 6.9   Smokeless tobacco: Never   Tobacco comments:    Smoked a couple of years, 2 to 3 cigs per day  Vaping Use   Vaping Use: Never  used  Substance and Sexual Activity   Alcohol use: Yes    Comment: rarely   Drug use: Never   Sexual activity: Not Currently  Other Topics Concern   Not on file  Social History Narrative   Not on file   Social Determinants of Health   Financial Resource Strain: Not on file  Food Insecurity: No Food Insecurity (06/29/2022)   Hunger Vital Sign    Worried About Running Out of Food in the Last Year: Never true    Ran Out of Food in the Last Year: Never true  Transportation Needs: No Transportation Needs (06/29/2022)   PRAPARE - Administrator, Civil Service (Medical): No    Lack of Transportation (Non-Medical): No  Physical Activity: Not on file  Stress: Not on file  Social Connections: Not on file   Past Medical History:  Diagnosis Date   Irritable bowel syndrome with diarrhea    Type 1 diabetes mellitus without complications (HCC)    Urticaria    Past Surgical History:  Procedure Laterality Date   NO PAST SURGERIES      Family History  Problem Relation Age of Onset   Diabetes type I Mother    High blood pressure Mother    Cancer Maternal Grandmother    Breast cancer Neg Hx     Review of Systems CONSTITUTIONAL:  Negative for chills, fatigue, fever, unintentional weight gain and unintentional weight loss.  E/N/T: Negative for ear pain, nasal congestion and sore throat.  CARDIOVASCULAR: Negative for chest pain, dizziness, palpitations and pedal edema.  RESPIRATORY: Negative for recent cough and dyspnea.  GASTROINTESTINAL: Negative for abdominal pain, acid reflux symptoms, constipation, diarrhea, nausea and vomiting.  MSK: Negative for arthralgias and myalgias.  INTEGUMENTARY: Negative for rash.  NEUROLOGICAL: Negative for dizziness and headaches.  PSYCHIATRIC: Negative for sleep disturbance and to question depression screen.  Negative for depression, negative for anhedonia.       Objective:  PHYSICAL EXAM:   VS: BP 102/80   Pulse 70   Temp 98.2 F  (36.8 C)   Resp 14   Ht 5\' 7"  (1.702 m)   Wt 159 lb (72.1 kg)   LMP 09/21/2022 (Exact Date)   SpO2 99%   BMI 24.90 kg/m   GEN: Well nourished, well developed, in no acute distress  HEENT: normal external ears and nose - normal external auditory canals and TMS - hearing grossly normal- Lips, Teeth and Gums - normal  Oropharynx - normal mucosa, palate, and posterior pharynx Neck: no JVD or masses - no thyromegaly Cardiac: RRR; no murmurs, rubs, or gallops,no edema - no significant varicosities Respiratory:  normal respiratory rate and pattern with no distress - normal breath sounds with no rales, rhonchi, wheezes or rubs GI: normal bowel sounds, no masses or tenderness MS: no deformity or atrophy  Skin: warm and dry, no rash  Neuro:  Alert and Oriented x 3, Strength and sensation are intact - CN II-Xii grossly intact Psych: euthymic mood, appropriate affect and demeanor Office Visit on 09/22/2022  Component Date Value Ref Range Status   Color, UA 09/22/2022 yellow  yellow Final   Clarity, UA 09/22/2022 clear  clear Final   Glucose, UA 09/22/2022 negative  negative mg/dL Final   Bilirubin, UA 09/24/2022 negative  negative Final   Ketones, POC UA 09/22/2022 negative  negative mg/dL Final   Spec Grav, UA 09/24/2022 1.025  1.010 - 1.025 Final   Blood, UA 09/22/2022 large (A)  negative Final   pH, UA 09/22/2022 5.0  5.0 - 8.0 Final   POC PROTEIN,UA 09/22/2022 negative  negative, trace Final   Urobilinogen, UA 09/22/2022 0.2  0.2 or 1.0 E.U./dL Final   Nitrite, UA 09/24/2022 Negative  Negative Final   Leukocytes, UA 09/22/2022 Negative  Negative Final    Lab Results  Component Value Date   WBC 6.7 03/16/2021   HGB 12.3 03/16/2021   HCT 36.9 03/16/2021   PLT 334 03/16/2021   GLUCOSE 199 (H) 03/16/2021   CHOL 156 09/21/2021   TRIG 92 09/21/2021   HDL 61 09/21/2021   LDLCALC 78 09/21/2021   ALT 14 03/16/2021   AST 16 03/16/2021   NA 136 03/16/2021   K 4.3 03/16/2021   CL 100  03/16/2021   CREATININE 0.73 03/16/2021   BUN 9 03/16/2021   CO2 23 03/16/2021   TSH 2.840 03/16/2021   HGBA1C 6.9 05/12/2022      Assessment & Plan:   Problem List Items Addressed This Visit       Endocrine   Type 1 diabetes mellitus without complication (HCC)   Relevant Medications   Glucagon (GVOKE HYPOPEN 2-PACK) 0.5 MG/0.1ML SOAJ   Other Relevant Orders   POCT URINALYSIS DIP (CLINITEK) (Completed)   Microalbumin / creatinine urine ratio   Other Visit Diagnoses     Well adult exam    -  Primary   Relevant Orders   POCT URINALYSIS DIP (CLINITEK) (Completed)   CBC with Differential/Platelet   Comprehensive metabolic panel   TSH   Lipid panel         Body mass index is 24.9 kg/m.   These are the goals we discussed:  Goals       " I want my A1c to be lower" (pt-stated)      Care Coordination Interventions: Reviewed medications with patient and discussed importance of medication adherence Counseled on importance of regular laboratory monitoring as prescribed Discussed plans with patient for ongoing care management follow up and provided patient with direct contact information for care management team Reviewed scheduled/upcoming provider appointments including: PCP and endocrinologist Review of patient status, including review of consultants reports, relevant laboratory and other test results, and medications completed Reviewed with patient to discuss the stoppage of her medication for her allergies and determine if this could be the cause of increased CBG . Reviewed with patient that she has no s/s of infection Reviewed patients goals for her own A1c             This is a list of the screening recommended for you and due dates:  Health Maintenance  Topic Date Due   Yearly kidney health urinalysis for diabetes  Never done   Yearly kidney function blood test for diabetes  03/16/2022   Flu Shot  01/07/2023*   Hemoglobin A1C  11/12/2022   Eye exam for  diabetics  02/14/2023   Mammogram  05/03/2023   Pap Smear  07/30/2023   DTaP/Tdap/Td vaccine (2 - Td or Tdap) 05/10/2030   HPV Vaccine  Aged Out   Complete foot exam   Discontinued   COVID-19 Vaccine  Discontinued   Hepatitis C Screening: USPSTF Recommendation to screen - Ages 43-79 yo.  Discontinued   HIV Screening  Discontinued  *Topic was postponed. The date shown is not the original due date.     Meds ordered this encounter  Medications   Glucagon (GVOKE HYPOPEN 2-PACK) 0.5 MG/0.1ML SOAJ    Sig: Inject 1 each into the skin as needed.    Dispense:  0.1 mL    Refill:  2    Order Specific Question:   Supervising Provider    Answer:   Corey Harold    Follow-up: Return in about 6 months (around 03/24/2023) for fasting follow up.  An After Visit Summary was printed and given to the patient.  Jettie Pagan [facility] [phone]

## 2022-09-24 LAB — LIPID PANEL
Chol/HDL Ratio: 2.9 ratio (ref 0.0–4.4)
Cholesterol, Total: 168 mg/dL (ref 100–199)
HDL: 58 mg/dL (ref >39)
LDL Chol Calc (NIH): 96 mg/dL (ref 0–99)
Triglycerides: 72 mg/dL (ref 0–149)
VLDL Cholesterol Cal: 14 mg/dL (ref 5–40)

## 2022-09-24 LAB — COMPREHENSIVE METABOLIC PANEL
ALT: 12 IU/L (ref 0–32)
AST: 16 IU/L (ref 0–40)
Albumin/Globulin Ratio: 2 (ref 1.2–2.2)
Albumin: 4.5 g/dL (ref 3.9–4.9)
Alkaline Phosphatase: 39 IU/L — ABNORMAL LOW (ref 44–121)
BUN/Creatinine Ratio: 12 (ref 9–23)
BUN: 10 mg/dL (ref 6–24)
Bilirubin Total: 0.6 mg/dL (ref 0.0–1.2)
CO2: 22 mmol/L (ref 20–29)
Calcium: 9.2 mg/dL (ref 8.7–10.2)
Chloride: 108 mmol/L — ABNORMAL HIGH (ref 96–106)
Creatinine, Ser: 0.86 mg/dL (ref 0.57–1.00)
Globulin, Total: 2.3 g/dL (ref 1.5–4.5)
Glucose: 105 mg/dL — ABNORMAL HIGH (ref 70–99)
Potassium: 5.2 mmol/L (ref 3.5–5.2)
Sodium: 142 mmol/L (ref 134–144)
Total Protein: 6.8 g/dL (ref 6.0–8.5)
eGFR: 86 mL/min/{1.73_m2} (ref >59)

## 2022-09-24 LAB — CBC WITH DIFFERENTIAL/PLATELET
Basophils Absolute: 0 10*3/uL (ref 0.0–0.2)
Basos: 0 %
EOS (ABSOLUTE): 0.1 10*3/uL (ref 0.0–0.4)
Eos: 2 %
Hematocrit: 38.4 % (ref 34.0–46.6)
Hemoglobin: 13.2 g/dL (ref 11.1–15.9)
Immature Grans (Abs): 0 10*3/uL (ref 0.0–0.1)
Immature Granulocytes: 0 %
Lymphocytes Absolute: 2.1 10*3/uL (ref 0.7–3.1)
Lymphs: 37 %
MCH: 31.3 pg (ref 26.6–33.0)
MCHC: 34.4 g/dL (ref 31.5–35.7)
MCV: 91 fL (ref 79–97)
Monocytes Absolute: 0.5 10*3/uL (ref 0.1–0.9)
Monocytes: 9 %
Neutrophils Absolute: 3 10*3/uL (ref 1.4–7.0)
Neutrophils: 52 %
Platelets: 328 10*3/uL (ref 150–450)
RBC: 4.22 x10E6/uL (ref 3.77–5.28)
RDW: 11.8 % (ref 11.7–15.4)
WBC: 5.7 10*3/uL (ref 3.4–10.8)

## 2022-09-24 LAB — MICROALBUMIN / CREATININE URINE RATIO
Creatinine, Urine: 169.4 mg/dL
Microalb/Creat Ratio: 4 mg/g creat (ref 0–29)
Microalbumin, Urine: 6.1 ug/mL

## 2022-09-24 LAB — TSH: TSH: 5.43 u[IU]/mL — ABNORMAL HIGH (ref 0.450–4.500)

## 2022-09-24 LAB — CARDIOVASCULAR RISK ASSESSMENT

## 2022-09-25 ENCOUNTER — Other Ambulatory Visit: Payer: Self-pay | Admitting: Physician Assistant

## 2022-09-25 DIAGNOSIS — R899 Unspecified abnormal finding in specimens from other organs, systems and tissues: Secondary | ICD-10-CM

## 2022-10-30 ENCOUNTER — Other Ambulatory Visit: Payer: BC Managed Care – PPO

## 2022-10-30 DIAGNOSIS — R899 Unspecified abnormal finding in specimens from other organs, systems and tissues: Secondary | ICD-10-CM | POA: Diagnosis not present

## 2022-10-31 ENCOUNTER — Other Ambulatory Visit: Payer: Self-pay | Admitting: Physician Assistant

## 2022-10-31 DIAGNOSIS — R899 Unspecified abnormal finding in specimens from other organs, systems and tissues: Secondary | ICD-10-CM

## 2022-10-31 LAB — TSH: TSH: 5.29 u[IU]/mL — ABNORMAL HIGH (ref 0.450–4.500)

## 2022-10-31 LAB — T4, FREE: Free T4: 0.86 ng/dL (ref 0.82–1.77)

## 2022-11-10 ENCOUNTER — Telehealth: Payer: Self-pay | Admitting: *Deleted

## 2022-11-10 NOTE — Progress Notes (Signed)
  Care Coordination Note  11/10/2022 Name: Susan Chapman MRN: [id number] DOB: 01-11-1981  Susan Chapman is a 42 y.o. year old female who is a primary care patient of Marge Duncans, [facility] and is actively engaged with the care management team. I reached out to Susan Chapman by phone today to assist with re-scheduling a follow up visit with the RN Case Manager  Follow up plan: Patient declines further follow up and engagement by the care management team. Appropriate care team members and provider have been notified via electronic communication.   Susan Chapman, [place] Direct Dial: [phone]

## 2022-12-14 ENCOUNTER — Other Ambulatory Visit: Payer: Self-pay | Admitting: Physician Assistant

## 2022-12-14 DIAGNOSIS — F5101 Primary insomnia: Secondary | ICD-10-CM

## 2023-01-02 ENCOUNTER — Encounter: Payer: Self-pay | Admitting: Physician Assistant

## 2023-01-25 DIAGNOSIS — Z9641 Presence of insulin pump (external) (internal): Secondary | ICD-10-CM | POA: Diagnosis not present

## 2023-01-25 DIAGNOSIS — E109 Type 1 diabetes mellitus without complications: Secondary | ICD-10-CM | POA: Diagnosis not present

## 2023-02-05 ENCOUNTER — Other Ambulatory Visit: Payer: BC Managed Care – PPO

## 2023-02-05 DIAGNOSIS — R899 Unspecified abnormal finding in specimens from other organs, systems and tissues: Secondary | ICD-10-CM | POA: Diagnosis not present

## 2023-02-06 LAB — T4, FREE: Free T4: 0.85 ng/dL (ref 0.82–1.77)

## 2023-02-06 LAB — TSH: TSH: 5.1 u[IU]/mL — ABNORMAL HIGH (ref 0.450–4.500)

## 2023-02-28 DIAGNOSIS — E109 Type 1 diabetes mellitus without complications: Secondary | ICD-10-CM | POA: Diagnosis not present

## 2023-03-12 ENCOUNTER — Other Ambulatory Visit: Payer: Self-pay | Admitting: Physician Assistant

## 2023-03-12 DIAGNOSIS — F5101 Primary insomnia: Secondary | ICD-10-CM

## 2023-03-21 ENCOUNTER — Ambulatory Visit (INDEPENDENT_AMBULATORY_CARE_PROVIDER_SITE_OTHER): Payer: BC Managed Care – PPO | Admitting: Physician Assistant

## 2023-03-21 ENCOUNTER — Encounter: Payer: Self-pay | Admitting: Physician Assistant

## 2023-03-21 VITALS — BP 102/70 | HR 66 | Temp 97.5°F | Ht 67.0 in | Wt 155.6 lb

## 2023-03-21 DIAGNOSIS — R928 Other abnormal and inconclusive findings on diagnostic imaging of breast: Secondary | ICD-10-CM

## 2023-03-21 DIAGNOSIS — F5101 Primary insomnia: Secondary | ICD-10-CM | POA: Diagnosis not present

## 2023-03-21 DIAGNOSIS — E782 Mixed hyperlipidemia: Secondary | ICD-10-CM | POA: Diagnosis not present

## 2023-03-21 DIAGNOSIS — E109 Type 1 diabetes mellitus without complications: Secondary | ICD-10-CM | POA: Diagnosis not present

## 2023-03-21 LAB — COMPREHENSIVE METABOLIC PANEL
ALT: 13 IU/L (ref 0–32)
AST: 19 IU/L (ref 0–40)
Albumin/Globulin Ratio: 1.7
Albumin: 4.5 g/dL (ref 3.9–4.9)
Alkaline Phosphatase: 40 IU/L — ABNORMAL LOW (ref 44–121)
BUN/Creatinine Ratio: 15 (ref 9–23)
BUN: 13 mg/dL (ref 6–24)
Bilirubin Total: 0.6 mg/dL (ref 0.0–1.2)
CO2: 22 mmol/L (ref 20–29)
Calcium: 9.7 mg/dL (ref 8.7–10.2)
Chloride: 108 mmol/L — ABNORMAL HIGH (ref 96–106)
Creatinine, Ser: 0.87 mg/dL (ref 0.57–1.00)
Globulin, Total: 2.7 g/dL (ref 1.5–4.5)
Glucose: 57 mg/dL — ABNORMAL LOW (ref 70–99)
Potassium: 4.9 mmol/L (ref 3.5–5.2)
Sodium: 143 mmol/L (ref 134–144)
Total Protein: 7.2 g/dL (ref 6.0–8.5)
eGFR: 85 mL/min/{1.73_m2} (ref >59)

## 2023-03-21 LAB — CBC WITH DIFFERENTIAL/PLATELET
Basophils Absolute: 0 10*3/uL (ref 0.0–0.2)
Basos: 0 %
EOS (ABSOLUTE): 0.1 10*3/uL (ref 0.0–0.4)
Eos: 2 %
Hematocrit: 40.4 % (ref 34.0–46.6)
Hemoglobin: 13.2 g/dL (ref 11.1–15.9)
Immature Grans (Abs): 0 10*3/uL (ref 0.0–0.1)
Immature Granulocytes: 0 %
Lymphocytes Absolute: 2 10*3/uL (ref 0.7–3.1)
Lymphs: 39 %
MCH: 30.6 pg (ref 26.6–33.0)
MCHC: 32.7 g/dL (ref 31.5–35.7)
MCV: 94 fL (ref 79–97)
Monocytes Absolute: 0.4 10*3/uL (ref 0.1–0.9)
Monocytes: 7 %
Neutrophils Absolute: 2.7 10*3/uL (ref 1.4–7.0)
Neutrophils: 52 %
Platelets: 301 10*3/uL (ref 150–450)
RBC: 4.32 x10E6/uL (ref 3.77–5.28)
RDW: 11.6 % — ABNORMAL LOW (ref 11.7–15.4)
WBC: 5.1 10*3/uL (ref 3.4–10.8)

## 2023-03-21 LAB — LIPID PANEL
Chol/HDL Ratio: 2.5 ratio (ref 0.0–4.4)
Cholesterol, Total: 168 mg/dL (ref 100–199)
HDL: 68 mg/dL (ref >39)
LDL Chol Calc (NIH): 87 mg/dL (ref 0–99)
Triglycerides: 70 mg/dL (ref 0–149)
VLDL Cholesterol Cal: 13 mg/dL (ref 5–40)

## 2023-03-21 MED ORDER — QUETIAPINE FUMARATE 50 MG PO TABS: ORAL_TABLET | ORAL | 1 refills | Status: DC

## 2023-03-21 NOTE — Progress Notes (Signed)
Acute Office Visit  Subjective:    Patient ID: Susan Chapman, female    DOB: 1981/05/14, 42 y.o.   MRN: [id number]  Chief Complaint  Patient presents with   Medical Management of Chronic Issues    Insomnia   Patient is in today for follow up of insomnia - pt is in for refill of seroquel 50mg  qhs - she states this medication is working very well for her  Pt is diabetic and follows regularly with Dr Katrinka Blazing - states her last a1c was in April and was 5.3-   She had slightly abnormal T4 and will follow with their office regarding that issue  Pt is due for bilateral diagnostic mammogram next month - pt states she has made that appt Past Medical History:  Diagnosis Date   Irritable bowel syndrome with diarrhea    Type 1 diabetes mellitus without complications (HCC)    Urticaria     Past Surgical History:  Procedure Laterality Date   NO PAST SURGERIES      Family History  Problem Relation Age of Onset   Diabetes type I Mother    High blood pressure Mother    Cancer Maternal Grandmother    Breast cancer Neg Hx     Social History   Socioeconomic History   Marital status: Single    Spouse name: Not on file   Number of children: Not on file   Years of education: Not on file   Highest education level: Bachelor's degree (e.g., BA, AB, BS)  Occupational History   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[employer]  Tobacco Use   Smoking status: Former    Types: Cigarettes    Quit date: 2017    Years since quitting: 7.4   Smokeless tobacco: Never   Tobacco comments:    Smoked a couple of years, 2 to 3 cigs per day  Vaping Use   Vaping Use: Never used  Substance and Sexual Activity   Alcohol use: Yes    Comment: rarely   Drug use: Never   Sexual activity: Not Currently  Other Topics Concern   Not on file  Social History Narrative   Not on file   Social Determinants of Health   Financial Resource Strain: Medium Risk (03/19/2023)   Overall Financial Resource Strain (CARDIA)     Difficulty of Paying Living Expenses: Somewhat hard  Food Insecurity: No Food Insecurity (03/19/2023)   Hunger Vital Sign    Worried About Running Out of Food in the Last Year: Never true    Ran Out of Food in the Last Year: Never true  Transportation Needs: No Transportation Needs (03/19/2023)   PRAPARE - Administrator, Civil Service (Medical): No    Lack of Transportation (Non-Medical): No  Physical Activity: Sufficiently Active (03/19/2023)   Exercise Vital Sign    Days of Exercise per Week: 7 days    Minutes of Exercise per Session: 120 min  Stress: No Stress Concern Present (03/19/2023)   [name] - Occupational Stress Questionnaire    Feeling of Stress : Only a little  Social Connections: Moderately Integrated (03/19/2023)   Social Connection and Isolation Panel [NHANES]    Frequency of Communication with Friends and Family: More than three times a week    Frequency of Social Gatherings with Friends and Family: More than three times a week    Attends Religious Services: More than 4 times per year    Active Member of [employer] or Organizations:  Yes    Attends Club or Organization Meetings: More than 4 times per year    Marital Status: Never married  Intimate Partner Violence: Not At Risk (03/21/2023)   Humiliation, Afraid, Rape, and Kick questionnaire    Fear of Current or Ex-Partner: No    Emotionally Abused: No    Physically Abused: No    Sexually Abused: No    Outpatient Medications Prior to Visit  Medication Sig Dispense Refill   Continuous Blood Gluc Sensor (DEXCOM G7 SENSOR) MISC Inject 1 sensor to the skin every 10 days for continuous glucose monitoring.     CONTOUR NEXT TEST test strip SMARTSIG:Via Meter 8 Times Daily     dicyclomine (BENTYL) 10 MG capsule TAKE 1 CAPSULE (10 MG TOTAL) BY MOUTH IN THE MORNING AND AT BEDTIME. 180 capsule 3   EPINEPHrine 0.3 mg/0.3 mL IJ SOAJ injection Inject 0.3 mLs (0.3 mg total) into the muscle as  needed for anaphylaxis. 2 each 3   Glucagon (GVOKE HYPOPEN 2-PACK) 0.5 MG/0.1ML SOAJ Inject 1 each into the skin as needed. 0.1 mL 2   Insulin Disposable Pump (OMNIPOD DASH 5 PACK PODS) MISC USE TO ADMINISTER INSULIN. CHANGE EVERY 72 HOURS     Multiple Vitamin (MULTIVITAMIN) tablet Take 1 tablet by mouth daily.     NOVOLOG 100 UNIT/ML injection SMARTSIG:0-60 Unit(s) SUB-Q Daily     rosuvastatin (CRESTOR) 5 MG tablet Take 5 mg by mouth daily.     QUEtiapine (SEROQUEL) 50 MG tablet TAKE 1 TABLET BY MOUTH EVERYDAY AT BEDTIME 90 tablet 0   No facility-administered medications prior to visit.    No Known Allergies CONSTITUTIONAL: Negative for chills, fatigue, fever, unintentional weight gain and unintentional weight loss.  CARDIOVASCULAR: Negative for chest pain, dizziness,  RESPIRATORY: Negative for recent cough and dyspnea.  GASTROINTESTINAL: Negative for abdominal pain, acid reflux symptoms, constipation, diarrhea, nausea and vomiting.  PSYCHIATRIC: Negative for sleep disturbance and to question depression screen.  Negative for depression, negative for anhedonia.         Objective:  PHYSICAL EXAM:   VS: BP 102/70 (BP Location: Left Arm, Patient Position: Sitting, Cuff Size: Normal)   Pulse 66   Temp (!) 97.5 F (36.4 C) (Temporal)   Ht 5\' 7"  (1.702 m)   Wt 155 lb 9.6 oz (70.6 kg)   SpO2 97%   BMI 24.37 kg/m   GEN: Well nourished, well developed, in no acute distress  Cardiac: RRR; no murmurs, rubs, or gallops,no edema - Respiratory:  normal respiratory rate and pattern with no distress - normal breath sounds with no rales, rhonchi, wheezes or rubs MS: no deformity or atrophy  Skin: warm and dry, no rash  Psych: euthymic mood, appropriate affect and demeanor   Health Maintenance Due  Topic Date Due   OPHTHALMOLOGY EXAM  02/14/2023    There are no preventive care reminders to display for this patient.   Lab Results  Component Value Date   TSH 5.100 (H) 02/05/2023    Lab Results  Component Value Date   WBC 5.7 09/22/2022   HGB 13.2 09/22/2022   HCT 38.4 09/22/2022   MCV 91 09/22/2022   PLT 328 09/22/2022   Lab Results  Component Value Date   NA 142 09/22/2022   K 5.2 09/22/2022   CO2 22 09/22/2022   GLUCOSE 105 (H) 09/22/2022   BUN 10 09/22/2022   CREATININE 0.86 09/22/2022   BILITOT 0.6 09/22/2022   ALKPHOS 39 (L) 09/22/2022   AST 16 09/22/2022  ALT 12 09/22/2022   PROT 6.8 09/22/2022   ALBUMIN 4.5 09/22/2022   CALCIUM 9.2 09/22/2022   EGFR 86 09/22/2022   Lab Results  Component Value Date   CHOL 168 09/22/2022   Lab Results  Component Value Date   HDL 58 09/22/2022   Lab Results  Component Value Date   LDLCALC 96 09/22/2022   Lab Results  Component Value Date   TRIG 72 09/22/2022   Lab Results  Component Value Date   CHOLHDL 2.9 09/22/2022   Lab Results  Component Value Date   HGBA1C 5.5 09/21/2022       Assessment & Plan:  1. Type 1 diabetes mellitus without complication (HCC) - CBC with Differential/Platelet - Comprehensive metabolic panel - Lipid panel Continue follow up with endocrine 2. Primary insomnia - QUEtiapine (SEROQUEL) 50 MG tablet; 1 po qd  Dispense: 90 tablet; Refill: 1  3. Abnormal mammogram Follow up for appt in July  Meds ordered this encounter  Medications   QUEtiapine (SEROQUEL) 50 MG tablet    Sig: 1 po qd    Dispense:  90 tablet    Refill:  1    Order Specific Question:   Supervising Provider    AnswerCorey Harold    Orders Placed This Encounter  Procedures   CBC with Differential/Platelet   Comprehensive metabolic panel   Lipid panel       Follow-up: Return in about 6 months (around 09/20/2023) for fasting physical - 20 min.  An After Visit Summary was printed and given to the patient.  Jettie Pagan [facility] [phone]

## 2023-04-09 ENCOUNTER — Other Ambulatory Visit: Payer: Self-pay | Admitting: Physician Assistant

## 2023-04-09 DIAGNOSIS — R921 Mammographic calcification found on diagnostic imaging of breast: Secondary | ICD-10-CM

## 2023-05-08 ENCOUNTER — Ambulatory Visit
Admission: RE | Admit: 2023-05-08 | Discharge: 2023-05-08 | Disposition: A | Discharge status: Home / Self Care | Payer: BC Managed Care – PPO | Source: Ambulatory Visit | Attending: Physician Assistant | Admitting: Physician Assistant

## 2023-05-08 DIAGNOSIS — R921 Mammographic calcification found on diagnostic imaging of breast: Secondary | ICD-10-CM

## 2023-05-20 IMAGING — MG MM DIGITAL DIAGNOSTIC UNILAT*L* W/ TOMO W/ CAD
6 series · 6 of 14 positions shown · non-contrast
Comparison: Previous exam(s).

CLINICAL DATA: 40-year-old female presenting for six-month
follow-up of probably benign left breast calcifications.

EXAM:
DIGITAL DIAGNOSTIC UNILATERAL LEFT MAMMOGRAM WITH TOMOSYNTHESIS AND
CAD
TECHNIQUE: Left digital diagnostic mammography and breast tomosynthesis was
performed. The images were evaluated with computer-aided detection.

[L ML]
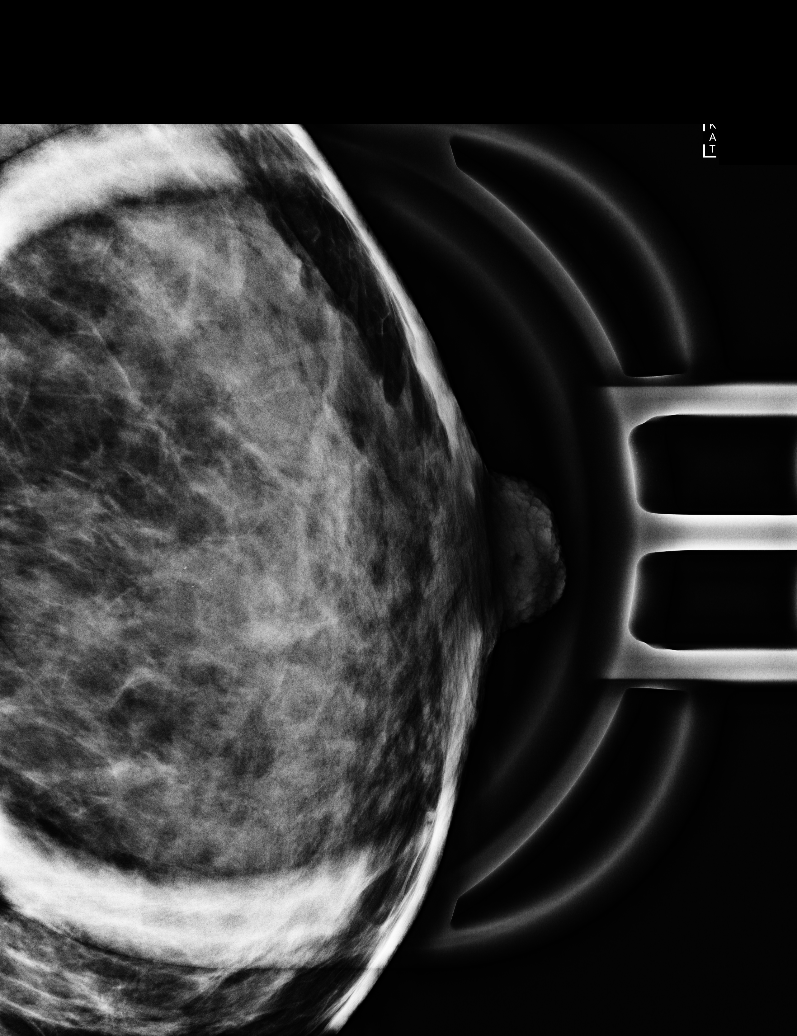

[L CC]
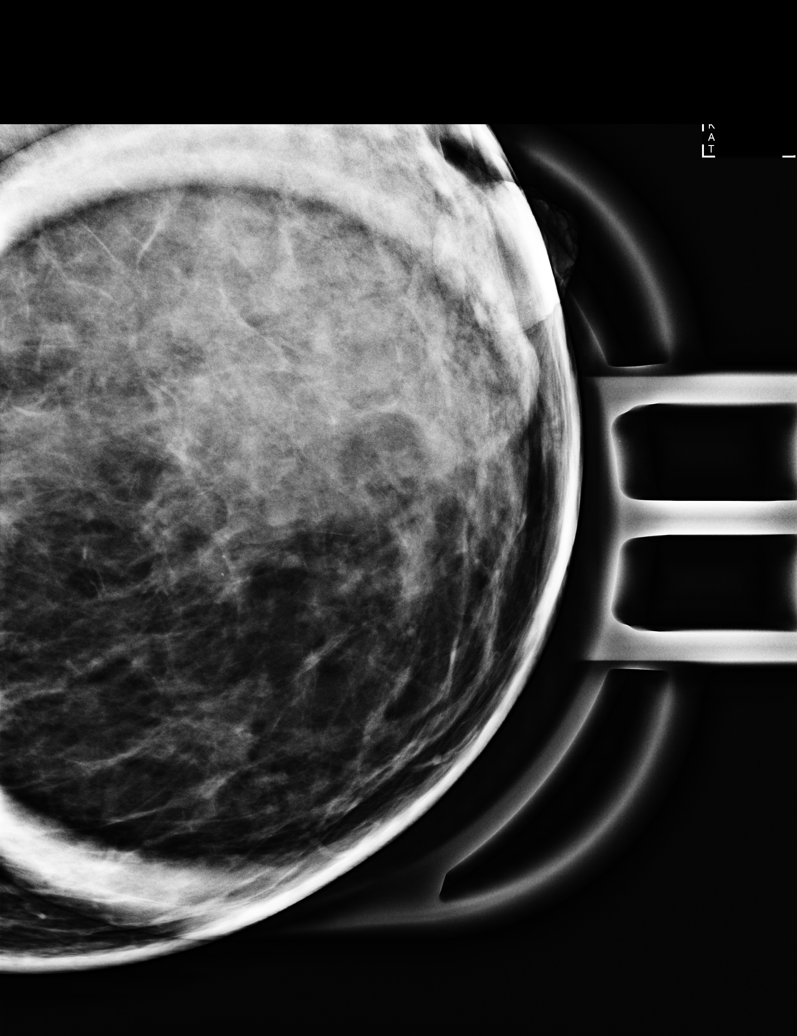

[L CC synth-2D]
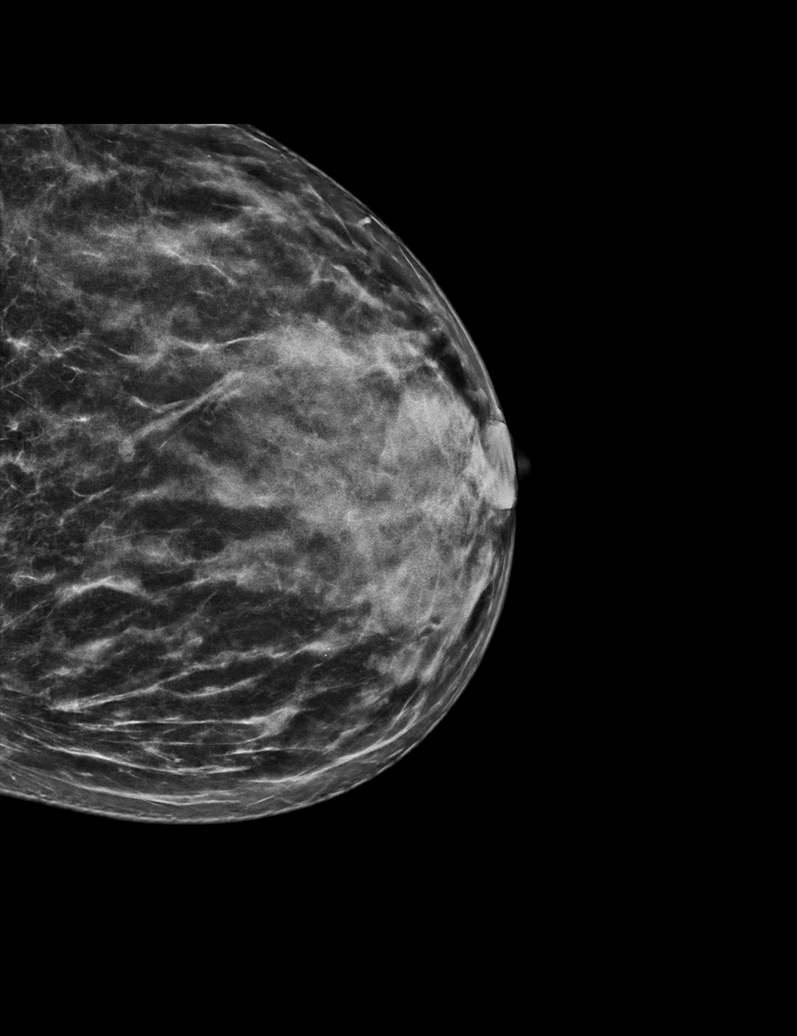

[L MLO synth-2D]
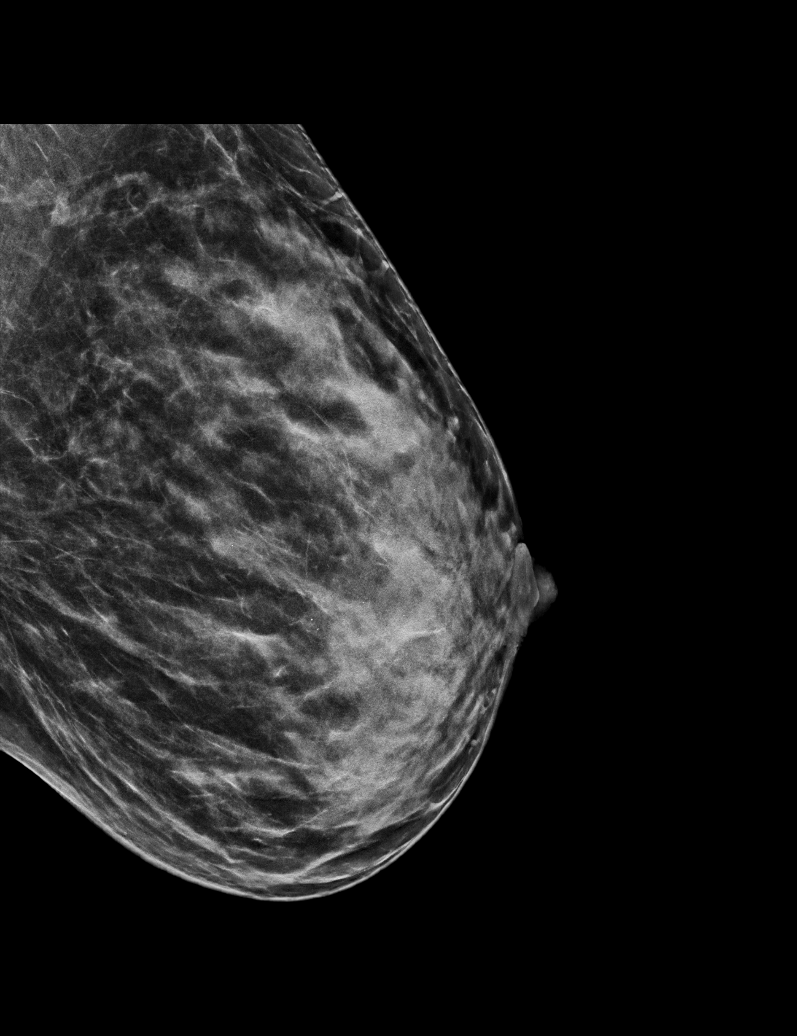

[L CC tomo · tomo slice 29/56.0]
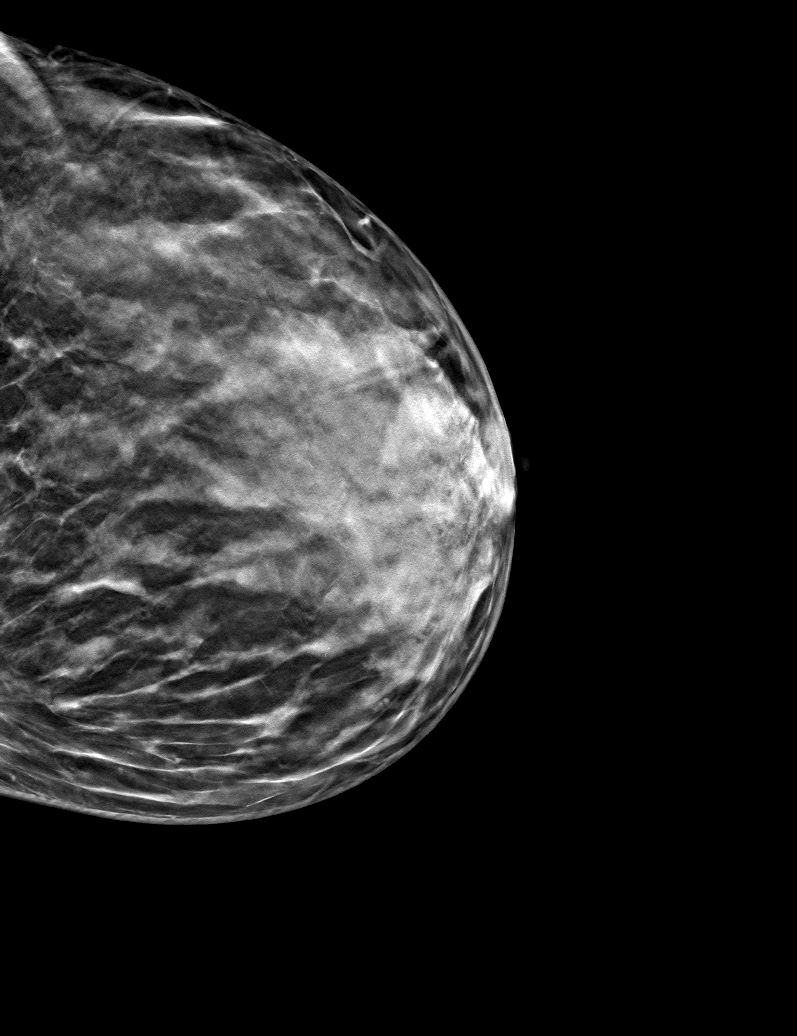

[L MLO tomo · tomo slice 29/57.0]
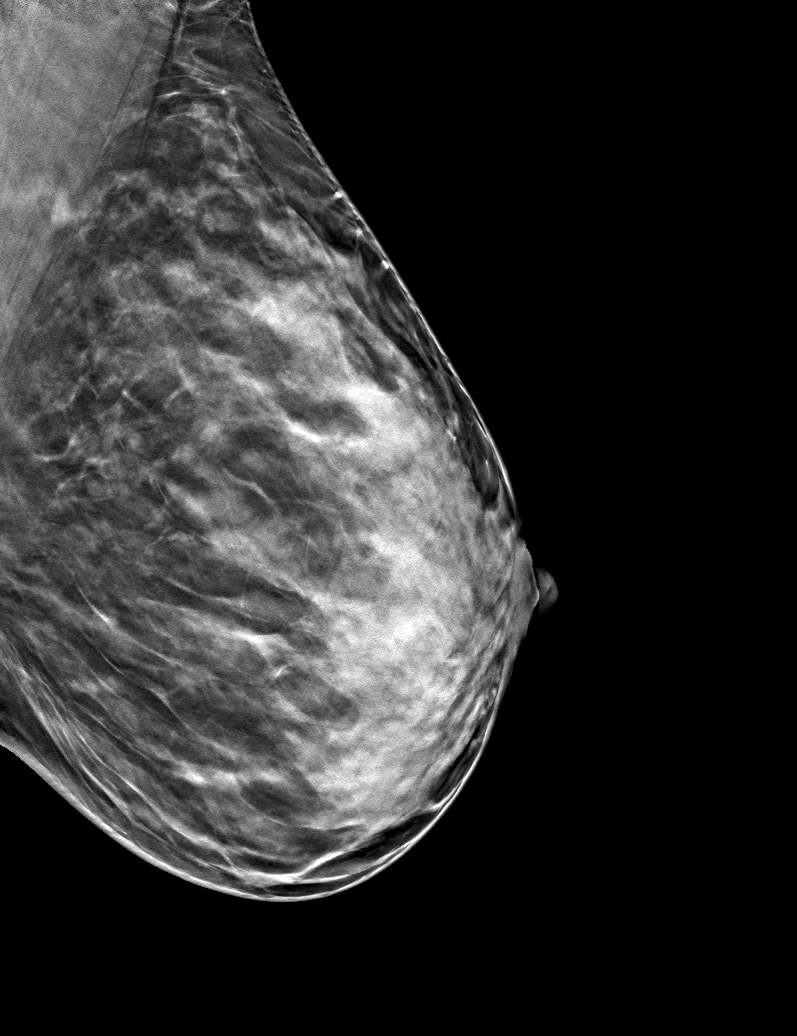

[6 of 14 positions shown; findings below may reference images not displayed]

ACR Breast Density Category c: The breast tissue is heterogeneously
dense, which may obscure small masses.
FINDINGS: Spot 2D magnification views of the left breast were performed in
addition to standard views. There is a stable small group of
calcifications in the medial left breast. No new suspicious linear
or branching forms. No associated mass or distortion. There are no
new findings elsewhere in the left breast.
IMPRESSION: Stable probably benign left breast calcifications.

RECOMMENDATION:
Diagnostic bilateral mammogram in 6 months.

I have discussed the findings and recommendations with the patient.
If applicable, a reminder letter will be sent to the patient
regarding the next appointment.

BI-RADS CATEGORY  3: Probably benign.

## 2023-06-06 ENCOUNTER — Other Ambulatory Visit: Payer: Self-pay | Admitting: Physician Assistant

## 2023-06-12 DIAGNOSIS — E10649 Type 1 diabetes mellitus with hypoglycemia without coma: Secondary | ICD-10-CM | POA: Diagnosis not present

## 2023-06-12 DIAGNOSIS — Z9641 Presence of insulin pump (external) (internal): Secondary | ICD-10-CM | POA: Diagnosis not present

## 2023-06-12 DIAGNOSIS — Z4681 Encounter for fitting and adjustment of insulin pump: Secondary | ICD-10-CM | POA: Diagnosis not present

## 2023-06-12 DIAGNOSIS — Z978 Presence of other specified devices: Secondary | ICD-10-CM | POA: Diagnosis not present

## 2023-06-12 LAB — HEMOGLOBIN A1C: Hemoglobin A1C: 5.1

## 2023-07-24 DIAGNOSIS — H169 Unspecified keratitis: Secondary | ICD-10-CM | POA: Diagnosis not present

## 2023-07-24 DIAGNOSIS — H109 Unspecified conjunctivitis: Secondary | ICD-10-CM | POA: Diagnosis not present

## 2023-09-26 ENCOUNTER — Encounter: Payer: Self-pay | Admitting: Physician Assistant

## 2023-09-26 ENCOUNTER — Ambulatory Visit: Payer: BC Managed Care – PPO | Admitting: Physician Assistant

## 2023-09-26 VITALS — BP 100/64 | HR 82 | Temp 97.6°F | Resp 12 | Ht 67.0 in | Wt 159.0 lb

## 2023-09-26 DIAGNOSIS — E109 Type 1 diabetes mellitus without complications: Secondary | ICD-10-CM | POA: Diagnosis not present

## 2023-09-26 DIAGNOSIS — Z0000 Encounter for general adult medical examination without abnormal findings: Secondary | ICD-10-CM | POA: Diagnosis not present

## 2023-09-26 LAB — POCT URINALYSIS DIP (CLINITEK)
Bilirubin, UA: NEGATIVE
Blood, UA: NEGATIVE
Glucose, UA: NEGATIVE mg/dL
Ketones, POC UA: NEGATIVE mg/dL
Leukocytes, UA: NEGATIVE
Nitrite, UA: NEGATIVE
POC PROTEIN,UA: NEGATIVE
Spec Grav, UA: 1.01 (ref 1.010–1.025)
Urobilinogen, UA: 0.2 U/dL
pH, UA: 6 (ref 5.0–8.0)

## 2023-09-26 LAB — HM DIABETES EYE EXAM

## 2023-09-26 NOTE — Progress Notes (Signed)
Subjective:  Patient ID: Susan Chapman, female    DOB: Jun 11, 1981  Age: 42 y.o. MRN: [id number]  Chief Complaint  Patient presents with   Annual Exam    HPI Well Adult Physical: Patient here for a comprehensive physical exam.The patient reports no problems Do you take any herbs or supplements that were not prescribed by a doctor? no Are you taking calcium supplements? no Are you taking aspirin daily? no Pt is following with endocrinology every 3 months - Dr Katrinka Blazing Encounter for general adult medical examination without abnormal findings  Physical ("At Risk" items are starred): Patient's last physical exam was 1 year ago .  Patient is not afflicted from Stress Incontinence and Urge Incontinence  Patient wears a seat belts Patient has smoke detectors and has carbon monoxide detectors. Patient practices appropriate gun safety. Patient wears sunscreen with extended sun exposure. Dental Care: brushes and flosses daily. Last dental visit: up to date Vision impairments: none Ophthalmology/Optometry: Annual visit.  Hearing loss: none  Patient's last menstrual period was 09/12/2023. Safe at home: Yes Self breast exams: Yes Last pap: 2019 - normal Last mammogram: 7/24     09/26/2023    9:27 AM 03/21/2023    9:19 AM 09/22/2022    9:05 AM 06/29/2022    3:28 PM 09/21/2021   10:08 AM  Depression screen PHQ 2/9  Decreased Interest 1 0 0 0 0  Down, Depressed, Hopeless 0 0 0 0 0  PHQ - 2 Score 1 0 0 0 0  Altered sleeping 1 1 1     Tired, decreased energy 0 0 0    Change in appetite 0 0 0    Feeling bad or failure about yourself  0 0 0    Trouble concentrating 0 0 1    Moving slowly or fidgety/restless 0 0 0    Suicidal thoughts 0 0 0    PHQ-9 Score 2 1 2     Difficult doing work/chores Not difficult at all Not difficult at all Not difficult at all           03/09/2021    3:13 PM 06/29/2022    3:56 PM 03/21/2023    9:16 AM 09/26/2023    9:26 AM  Fall Risk  Falls in the past  year? 0  0 0  Was there an injury with Fall? 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[employer]  Tobacco Use   Smoking status: Former    Current packs/day: 0.00    Types: Cigarettes    Quit date: 2017    Years since quitting: 7.9   Smokeless tobacco: Never   Tobacco comments:    Smoked a couple of years, 2 to 3 cigs per day  Vaping Use  Vaping status: Never Used  Substance and Sexual Activity   Alcohol use: Yes    Comment: rarely   Drug use: Never   Sexual activity: Not Currently  Other Topics Concern   Not on file  Social History Narrative   Not on file   Social Drivers of Health   Financial Resource Strain: Medium Risk (03/19/2023)   Overall Financial Resource Strain (CARDIA)    Difficulty of Paying Living Expenses: Somewhat hard  Food Insecurity: Low Risk  (06/12/2023)   Received from [facility]   Hunger Vital Sign    Worried About Running Out of Food in the Last Year: Never true    Ran Out of Food in the Last Year: Never true  Transportation Needs: No Transportation Needs (06/12/2023)   Received from [facility]    In the past 12 months, has lack of reliable transportation kept you from medical appointments, meetings, work or from getting things needed for daily living? : No  Physical Activity: Sufficiently Active (03/19/2023)   Exercise Vital Sign    Days of Exercise per Week: 7  days    Minutes of Exercise per Session: 120 min  Stress: No Stress Concern Present (03/19/2023)   [name] - Occupational Stress Questionnaire    Feeling of Stress : Only a little  Social Connections: Moderately Integrated (03/19/2023)   Social Connection and Isolation Panel [NHANES]    Frequency of Communication with Friends and Family: More than three times a week    Frequency of Social Gatherings with Friends and Family: More than three times a week    Attends Religious Services: More than 4 times per year    Active Member of [employer] or Organizations: Yes    Attends Engineer, structural: More than 4 times per year    Marital Status: Never married   Past Medical History:  Diagnosis Date   Irritable bowel syndrome with diarrhea    Type 1 diabetes mellitus without complications (HCC)    Urticaria    Past Surgical History:  Procedure Laterality Date   NO PAST SURGERIES      Family History  Problem Relation Age of Onset   Diabetes type I Mother    High blood pressure Mother    Cancer Maternal Grandmother    Breast cancer Neg Hx     ROS CONSTITUTIONAL: Negative for chills, fatigue, fever, unintentional weight gain and unintentional weight loss.  E/N/T: Negative for ear pain, nasal congestion and sore throat.  CARDIOVASCULAR: Negative for chest pain, dizziness, palpitations and pedal edema.  RESPIRATORY: Negative for recent cough and dyspnea.  GASTROINTESTINAL: Negative for abdominal pain, acid reflux symptoms, constipation, diarrhea, nausea and vomiting.  MSK: Negative for arthralgias and myalgias.  INTEGUMENTARY: Negative for rash.  NEUROLOGICAL: Negative for dizziness and headaches.  PSYCHIATRIC: Negative for sleep disturbance and to question depression screen.  Negative for depression, negative for anhedonia.   Objective:  PHYSICAL EXAM:   BP 100/64   Pulse 82   Temp 97.6 F (36.4 C)   Resp 12   Ht 5\' 7"  (1.702 m)   Wt 159 lb  (72.1 kg)   LMP 09/12/2023   SpO2 98%   BMI 24.90 kg/m   Vision Screening   Right eye Left eye Both eyes  Without correction     With correction 20/20 20/25 20/20    Marla Leals CMA - chaperone GEN: Well nourished, well developed, in no acute distress  HEENT: normal external  ears and nose - normal external auditory canals and TMS - hearing grossly normal -  - Lips, Teeth and Gums - normal  Oropharynx - normal mucosa, palate, and posterior pharynx Neck: no JVD or masses - no thyromegaly Cardiac: RRR; no murmurs, rubs, or gallops,no edema - no significant varicosities Respiratory:  normal respiratory rate and pattern with no distress - normal breath sounds with no rales, rhonchi, wheezes or rubs Breasts - no masses GI: normal bowel sounds, no masses or tenderness GU -normal external genitalia - cervix normal - pap obtained MS: no deformity or atrophy  Skin: warm and dry, no rash  Neuro:  Alert and Oriented x 3, Strength and sensation are intact - CN II-Xii grossly intact Psych: euthymic mood, appropriate affect and demeanor  Office Visit on 09/26/2023  Component Date Value Ref Range Status   Color, UA 09/26/2023 yellow  yellow Final   Clarity, UA 09/26/2023 clear  clear Final   Glucose, UA 09/26/2023 negative  negative mg/dL Final   Bilirubin, UA 40/98/1191 negative  negative Final   Ketones, POC UA 09/26/2023 negative  negative mg/dL Final   Spec Grav, UA 47/82/9562 1.010  1.010 - 1.025 Final   Blood, UA 09/26/2023 negative  negative Final   pH, UA 09/26/2023 6.0  5.0 - 8.0 Final   POC PROTEIN,UA 09/26/2023 negative  negative, trace Final   Urobilinogen, UA 09/26/2023 0.2  0.2 or 1.0 E.U./dL Final   Nitrite, UA 13/05/6577 Negative  Negative Final   Leukocytes, UA 09/26/2023 Negative  Negative Final      Assessment & Plan:  Well adult exam -     POCT URINALYSIS DIP (CLINITEK) -     CBC with Differential/Platelet -     Comprehensive metabolic panel -     Lipid panel -      TSH -     IGP, Aptima HPV, rfx 16/18,45  Type 1 diabetes mellitus without complication (HCC) -     POCT URINALYSIS DIP (CLINITEK) -     Microalbumin / creatinine urine ratio Follow up with endocrinology as directed   This is a list of the screening recommended for you and due dates:  Health Maintenance  Topic Date Due   Pap with HPV screening  07/29/2021   Yearly kidney health urinalysis for diabetes  09/23/2023   Eye exam for diabetics  09/26/2023*   Flu Shot  01/07/2024*   Hemoglobin A1C  12/10/2023   Yearly kidney function blood test for diabetes  03/20/2024   Mammogram  05/07/2024   DTaP/Tdap/Td vaccine (2 - Td or Tdap) 05/10/2030   HPV Vaccine  Aged Out   Complete foot exam   Discontinued   COVID-19 Vaccine  Discontinued   Hepatitis C Screening  Discontinued   HIV Screening  Discontinued  *Topic was postponed. The date shown is not the original due date.     Follow-up: Return in about 6 months (around 03/26/2024) for chronic fasting follow-up.  An After Visit Summary was printed and given to the patient.  Jettie Pagan [facility] [phone]

## 2023-09-26 NOTE — Telephone Encounter (Signed)
 Care team updated and letter sent for eye exam notes.

## 2023-09-27 LAB — MICROALBUMIN / CREATININE URINE RATIO
Creatinine, Urine: 26.6 mg/dL
Microalb/Creat Ratio: <11 mg/g{creat} (ref 0–29)
Microalbumin, Urine: <3 ug/mL

## 2023-09-27 LAB — CBC WITH DIFFERENTIAL/PLATELET
Basophils Absolute: 0 10*3/uL (ref 0.0–0.2)
Basos: 0 %
EOS (ABSOLUTE): 0.1 10*3/uL (ref 0.0–0.4)
Eos: 1 %
Hematocrit: 40.6 % (ref 34.0–46.6)
Hemoglobin: 13.4 g/dL (ref 11.1–15.9)
Immature Grans (Abs): 0.1 10*3/uL (ref 0.0–0.1)
Immature Granulocytes: 1 %
Lymphocytes Absolute: 2 10*3/uL (ref 0.7–3.1)
Lymphs: 28 %
MCH: 31.2 pg (ref 26.6–33.0)
MCHC: 33 g/dL (ref 31.5–35.7)
MCV: 94 fL (ref 79–97)
Monocytes Absolute: 0.5 10*3/uL (ref 0.1–0.9)
Monocytes: 8 %
Neutrophils Absolute: 4.3 10*3/uL (ref 1.4–7.0)
Neutrophils: 62 %
Platelets: 334 10*3/uL (ref 150–450)
RBC: 4.3 x10E6/uL (ref 3.77–5.28)
RDW: 11.8 % (ref 11.7–15.4)
WBC: 7 10*3/uL (ref 3.4–10.8)

## 2023-09-27 LAB — COMPREHENSIVE METABOLIC PANEL
ALT: 12 [IU]/L (ref 0–32)
AST: 17 [IU]/L (ref 0–40)
Albumin: 4.3 g/dL (ref 3.9–4.9)
Alkaline Phosphatase: 40 [IU]/L — ABNORMAL LOW (ref 44–121)
BUN/Creatinine Ratio: 15 (ref 9–23)
BUN: 13 mg/dL (ref 6–24)
Bilirubin Total: 0.3 mg/dL (ref 0.0–1.2)
CO2: 22 mmol/L (ref 20–29)
Calcium: 9.9 mg/dL (ref 8.7–10.2)
Chloride: 105 mmol/L (ref 96–106)
Creatinine, Ser: 0.86 mg/dL (ref 0.57–1.00)
Globulin, Total: 2.6 g/dL (ref 1.5–4.5)
Glucose: 87 mg/dL (ref 70–99)
Potassium: 5.3 mmol/L — ABNORMAL HIGH (ref 3.5–5.2)
Sodium: 140 mmol/L (ref 134–144)
Total Protein: 6.9 g/dL (ref 6.0–8.5)
eGFR: 86 mL/min/{1.73_m2} (ref >59)

## 2023-09-27 LAB — LIPID PANEL
Chol/HDL Ratio: 3.3 {ratio} (ref 0.0–4.4)
Cholesterol, Total: 211 mg/dL — ABNORMAL HIGH (ref 100–199)
HDL: 64 mg/dL (ref >39)
LDL Chol Calc (NIH): 126 mg/dL — ABNORMAL HIGH (ref 0–99)
Triglycerides: 119 mg/dL (ref 0–149)
VLDL Cholesterol Cal: 21 mg/dL (ref 5–40)

## 2023-09-27 LAB — TSH: TSH: 3.78 u[IU]/mL (ref 0.450–4.500)

## 2023-10-04 ENCOUNTER — Encounter: Payer: Self-pay | Admitting: Physician Assistant

## 2023-10-05 LAB — IGP, APTIMA HPV, RFX 16/18,45
HPV Aptima: NEGATIVE
PAP Smear Comment: 0

## 2023-11-06 DIAGNOSIS — Z9641 Presence of insulin pump (external) (internal): Secondary | ICD-10-CM | POA: Diagnosis not present

## 2023-11-06 DIAGNOSIS — Z978 Presence of other specified devices: Secondary | ICD-10-CM | POA: Diagnosis not present

## 2023-11-06 DIAGNOSIS — E109 Type 1 diabetes mellitus without complications: Secondary | ICD-10-CM | POA: Diagnosis not present

## 2023-11-06 LAB — HEMOGLOBIN A1C: Hemoglobin A1C: 5.3

## 2023-11-28 ENCOUNTER — Other Ambulatory Visit: Payer: Self-pay | Admitting: Physician Assistant

## 2023-11-28 DIAGNOSIS — F5101 Primary insomnia: Secondary | ICD-10-CM

## 2024-02-27 ENCOUNTER — Other Ambulatory Visit: Payer: Self-pay | Admitting: Physician Assistant

## 2024-03-07 DIAGNOSIS — E109 Type 1 diabetes mellitus without complications: Secondary | ICD-10-CM | POA: Diagnosis not present

## 2024-03-07 LAB — HM DIABETES EYE EXAM

## 2024-03-26 ENCOUNTER — Encounter: Payer: Self-pay | Admitting: Physician Assistant

## 2024-03-26 ENCOUNTER — Ambulatory Visit (INDEPENDENT_AMBULATORY_CARE_PROVIDER_SITE_OTHER): Payer: BC Managed Care – PPO | Admitting: Physician Assistant

## 2024-03-26 VITALS — BP 118/72 | HR 80 | Temp 98.7°F | Resp 18 | Ht 67.0 in | Wt 160.0 lb

## 2024-03-26 DIAGNOSIS — F5101 Primary insomnia: Secondary | ICD-10-CM | POA: Diagnosis not present

## 2024-03-26 DIAGNOSIS — Z1231 Encounter for screening mammogram for malignant neoplasm of breast: Secondary | ICD-10-CM

## 2024-03-26 DIAGNOSIS — E782 Mixed hyperlipidemia: Secondary | ICD-10-CM

## 2024-03-26 DIAGNOSIS — E109 Type 1 diabetes mellitus without complications: Secondary | ICD-10-CM

## 2024-03-26 MED ORDER — QUETIAPINE FUMARATE 50 MG PO TABS: 50.0000 mg | ORAL_TABLET | Freq: Every day | ORAL | 1 refills | Status: AC

## 2024-03-26 NOTE — Progress Notes (Signed)
 Acute Office Visit  Subjective:    Patient ID: Susan Chapman, female    DOB: 1981/06/11, 43 y.o.   MRN: [id number]  Chief Complaint  Patient presents with   Medical Management of Chronic Issues    Insomnia   Patient is in today for follow up of insomnia - pt is in for refill of seroquel  50mg  qhs - she states this medication is working very well for her  Pt is diabetic and follows regularly with Dr Felipe Horton - states her last a1c was in January at 5.3 She is currently using omnipod Next appt is April 08, 2024  Pt with hyperlipidemia - is currently on crestor 5mg  qd - does watch her diet and is physically active She is not fasting today but has follow up with endocrinology in a few weeks and will get lipid panel done at that time  Pt will be due for mammogram later this summer - would like to get scheduled Past Medical History:  Diagnosis Date   Irritable bowel syndrome with diarrhea    Type 1 diabetes mellitus without complications (HCC)    Urticaria     Past Surgical History:  Procedure Laterality Date   NO PAST SURGERIES      Family History  Problem Relation Age of Onset   Diabetes type I Mother    High blood pressure Mother    Cancer Maternal Grandmother    Breast cancer Neg Hx     Social History   Socioeconomic History   Marital status: Single    Spouse name: Not on file   Number of children: Not on file   Years of education: Not on file   Highest education level: Bachelor's degree (e.g., BA, AB, BS)  Occupational History   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[employer]  Tobacco Use   Smoking status: Former    Current packs/day: 0.00    Types: Cigarettes    Quit date: 2017    Years since quitting: 8.4   Smokeless tobacco: Never   Tobacco comments:    Smoked a couple of years, 2 to 3 cigs per day  Vaping Use   Vaping status: Never Used  Substance and Sexual Activity   Alcohol use: Yes    Comment: rarely   Drug use: Never   Sexual activity: Not Currently  Other  Topics Concern   Not on file  Social History Narrative   Not on file   Social Drivers of Health   Financial Resource Strain: Low Risk  (03/25/2024)   Overall Financial Resource Strain (CARDIA)    Difficulty of Paying Living Expenses: Not very hard  Food Insecurity: Food Insecurity Present (03/25/2024)   Hunger Vital Sign    Worried About Running Out of Food in the Last Year: Sometimes true    Ran Out of Food in the Last Year: Never true  Transportation Needs: No Transportation Needs (03/25/2024)   PRAPARE - Administrator, Civil Service (Medical): No    Lack of Transportation (Non-Medical): No  Physical Activity: Sufficiently Active (03/25/2024)   Exercise Vital Sign    Days of Exercise per Week: 7 days    Minutes of Exercise per Session: 140 min  Stress: Stress Concern Present (03/25/2024)   [name] - Occupational Stress Questionnaire    Feeling of Stress: To some extent  Social Connections: Moderately Isolated (03/25/2024)   Social Connection and Isolation Panel    Frequency of Communication with Friends and Family: More than three  times a week    Frequency of Social Gatherings with Friends and Family: Three times a week    Attends Religious Services: More than 4 times per year    Active Member of Clubs or Organizations: No    Attends Banker Meetings: Not on file    Marital Status: Never married  Intimate Partner Violence: Not At Risk (03/21/2023)   Humiliation, Afraid, Rape, and Kick questionnaire    Fear of Current or Ex-Partner: No    Emotionally Abused: No    Physically Abused: No    Sexually Abused: No    Outpatient Medications Prior to Visit  Medication Sig Dispense Refill   Continuous Blood Gluc Sensor (DEXCOM G7 SENSOR) MISC Inject 1 sensor to the skin every 10 days for continuous glucose monitoring.     CONTOUR NEXT TEST test strip SMARTSIG:Via Meter 8 Times Daily     dicyclomine  (BENTYL ) 10 MG capsule TAKE 1  CAPSULE (10 MG TOTAL) BY MOUTH IN THE MORNING AND AT BEDTIME. 180 capsule 1   EPINEPHrine  0.3 mg/0.3 mL IJ SOAJ injection Inject 0.3 mLs (0.3 mg total) into the muscle as needed for anaphylaxis. 2 each 3   Glucagon  (GVOKE HYPOPEN  2-PACK) 0.5 MG/0.1ML SOAJ Inject 1 each into the skin as needed. 0.1 mL 2   Insulin  Disposable Pump (OMNIPOD DASH 5 PACK PODS) MISC USE TO ADMINISTER INSULIN . CHANGE EVERY 72 HOURS     Multiple Vitamin (MULTIVITAMIN) tablet Take 1 tablet by mouth daily.     NOVOLOG 100 UNIT/ML injection SMARTSIG:0-60 Unit(s) SUB-Q Daily     rosuvastatin (CRESTOR) 5 MG tablet Take 5 mg by mouth daily.     QUEtiapine  (SEROQUEL ) 50 MG tablet TAKE 1 TABLET BY MOUTH EVERY DAY 90 tablet 1   No facility-administered medications prior to visit.    No Known Allergies CONSTITUTIONAL: Negative for chills, fatigue, fever, unintentional weight gain and unintentional weight loss.   CARDIOVASCULAR: Negative for chest pain, dizziness, palpitations and pedal edema.  RESPIRATORY: Negative for recent cough and dyspnea.  GASTROINTESTINAL: Negative for abdominal pain, acid reflux symptoms, constipation, diarrhea, nausea and vomiting.  MSK: Negative for arthralgias and myalgias.  INTEGUMENTARY: Negative for rash.   PSYCHIATRIC: Negative for sleep disturbance and to question depression screen.  Negative for depression, negative for anhedonia.         Objective:  PHYSICAL EXAM:   VS: BP 118/72   Pulse 80   Temp 98.7 F (37.1 C) (Temporal)   Resp 18   Ht 5' 7 (1.702 m)   Wt 160 lb (72.6 kg)   LMP 03/14/2024   SpO2 98%   BMI 25.06 kg/m   GEN: Well nourished, well developed, in no acute distress   Cardiac: RRR; no murmurs, rubs, or gallops,no edema -  Respiratory:  normal respiratory rate and pattern with no distress - normal breath sounds with no rales, rhonchi, wheezes or rubs  MS: no deformity or atrophy  Skin: warm and dry, no rash  Neuro:  Alert and Oriented x 3,  CN II-Xii grossly  intact Psych: euthymic mood, appropriate affect and demeanor  There are no preventive care reminders to display for this patient.   There are no preventive care reminders to display for this patient.    Lab Results  Component Value Date   TSH 3.780 09/26/2023   Lab Results  Component Value Date   WBC 7.0 09/26/2023   HGB 13.4 09/26/2023   HCT 40.6 09/26/2023   MCV 94 09/26/2023  PLT 334 09/26/2023   Lab Results  Component Value Date   NA 140 09/26/2023   K 5.3 (H) 09/26/2023   CO2 22 09/26/2023   GLUCOSE 87 09/26/2023   BUN 13 09/26/2023   CREATININE 0.86 09/26/2023   BILITOT 0.3 09/26/2023   ALKPHOS 40 (L) 09/26/2023   AST 17 09/26/2023   ALT 12 09/26/2023   PROT 6.9 09/26/2023   ALBUMIN 4.3 09/26/2023   CALCIUM 9.9 09/26/2023   EGFR 86 09/26/2023   Lab Results  Component Value Date   CHOL 211 (H) 09/26/2023   Lab Results  Component Value Date   HDL 64 09/26/2023   Lab Results  Component Value Date   LDLCALC 126 (H) 09/26/2023   Lab Results  Component Value Date   TRIG 119 09/26/2023   Lab Results  Component Value Date   CHOLHDL 3.3 09/26/2023   Lab Results  Component Value Date   HGBA1C 5.3 11/06/2023       Assessment & Plan:  1. Type 1 diabetes mellitus without complication (HCC) Labwork to be done with endocrinologist Continue follow up with endocrine 2. Primary insomnia - QUEtiapine  (SEROQUEL ) 50 MG tablet; 1 po qd  Dispense: 90 tablet; Refill: 1 3. Hyperlipidemia Continue crestor and watch diet 4. Breast cancer screening Mammogram scheduled Meds ordered this encounter  Medications   QUEtiapine  (SEROQUEL ) 50 MG tablet    Sig: Take 1 tablet (50 mg total) by mouth daily.    Dispense:  90 tablet    Refill:  1    Supervising Provider:   COX, KIRSTEN [phone]    Orders Placed This Encounter  Procedures   MM 3D SCREENING MAMMOGRAM BILATERAL BREAST   Hemoglobin A1c       Follow-up: Return in about 6 months (around 09/25/2024)  for fasting physical.  An After Visit Summary was printed and given to the patient.  Anthonette Bastos [facility] [phone]

## 2024-04-08 DIAGNOSIS — Z9641 Presence of insulin pump (external) (internal): Secondary | ICD-10-CM | POA: Diagnosis not present

## 2024-04-08 DIAGNOSIS — E109 Type 1 diabetes mellitus without complications: Secondary | ICD-10-CM | POA: Diagnosis not present

## 2024-05-12 ENCOUNTER — Ambulatory Visit (HOSPITAL_BASED_OUTPATIENT_CLINIC_OR_DEPARTMENT_OTHER)
Admission: RE | Admit: 2024-05-12 | Discharge: 2024-05-12 | Disposition: A | Discharge status: Home / Self Care | Source: Ambulatory Visit | Attending: Physician Assistant | Admitting: Physician Assistant

## 2024-05-12 DIAGNOSIS — Z1231 Encounter for screening mammogram for malignant neoplasm of breast: Secondary | ICD-10-CM | POA: Diagnosis not present

## 2024-05-13 ENCOUNTER — Telehealth: Payer: Self-pay

## 2024-05-13 NOTE — Telephone Encounter (Signed)
 It will be for office visit and labwork

## 2024-05-13 NOTE — Telephone Encounter (Signed)
 Patient states that she was told come back in 6 months for blood work. She has an appointment in 09/24/2024 with provider. Can she come only for blood work or she needs to see you first. Please advice.  Copied from CRM #[id number]. Topic: Clinical - Request for Lab/Test Order >> May 13, 2024 10:51 AM Cleave MATSU wrote: Reason for CRM: pt need lab done. She have a 6 month follow up appt in dec.

## 2024-08-19 ENCOUNTER — Other Ambulatory Visit: Payer: Self-pay | Admitting: Physician Assistant

## 2024-08-21 DIAGNOSIS — E10649 Type 1 diabetes mellitus with hypoglycemia without coma: Secondary | ICD-10-CM | POA: Diagnosis not present

## 2024-08-21 DIAGNOSIS — Z794 Long term (current) use of insulin: Secondary | ICD-10-CM | POA: Diagnosis not present

## 2024-09-24 ENCOUNTER — Ambulatory Visit: Admitting: Physician Assistant

## 2024-09-24 ENCOUNTER — Encounter: Payer: Self-pay | Admitting: Physician Assistant

## 2024-09-24 VITALS — BP 110/60 | HR 100 | Temp 97.6°F | Resp 14 | Ht 67.0 in | Wt 157.0 lb

## 2024-09-24 DIAGNOSIS — E782 Mixed hyperlipidemia: Secondary | ICD-10-CM | POA: Diagnosis not present

## 2024-09-24 DIAGNOSIS — F5101 Primary insomnia: Secondary | ICD-10-CM

## 2024-09-24 DIAGNOSIS — E119 Type 2 diabetes mellitus without complications: Secondary | ICD-10-CM

## 2024-09-24 NOTE — Progress Notes (Signed)
 Acute Office Visit  Subjective:    Patient ID: Susan Chapman, female    DOB: 1980/10/27, 43 y.o.   MRN: [id number]  Chief Complaint  Patient presents with   Medical Management of Chronic Issues    Insomnia   Patient is in today for follow up of insomnia - pt is in for refill of seroquel  50mg  qhs - she states this medication is working very well for her  Pt is diabetic and follows regularly with Dr Claudene - states her last a1c was at 5.8 - she had noted bad batch of insulin  and had to change out lot number vials and then glucose readings improved  Pt with hyperlipidemia - on crestor 5mg  qd - does exercise and watches diet Past Medical History:  Diagnosis Date   Irritable bowel syndrome with diarrhea    Type 1 diabetes mellitus without complications (HCC)    Urticaria     Past Surgical History:  Procedure Laterality Date   NO PAST SURGERIES      Family History  Problem Relation Age of Onset   Diabetes type I Mother    High blood pressure Mother    Cancer Maternal Grandmother    Breast cancer Neg Hx     Social History   Socioeconomic History   Marital status: Single    Spouse name: Not on file   Number of children: Not on file   Years of education: Not on file   Highest education level: Bachelor's degree (e.g., BA, AB, BS)  Occupational History   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[employer]  Tobacco Use   Smoking status: Former    Current packs/day: 0.00    Types: Cigarettes    Quit date: 2017    Years since quitting: 8.9   Smokeless tobacco: Never   Tobacco comments:    Smoked a couple of years, 2 to 3 cigs per day  Vaping Use   Vaping status: Never Used  Substance and Sexual Activity   Alcohol use: Yes    Comment: rarely   Drug use: Never   Sexual activity: Not Currently  Other Topics Concern   Not on file  Social History Narrative   Not on file   Social Drivers of Health   Tobacco Use: Medium Risk (09/24/2024)   Patient History    Smoking Tobacco Use:  Former    Smokeless Tobacco Use: Never    Passive Exposure: Not on Actuary Strain: Low Risk (09/21/2024)   Overall Financial Resource Strain (CARDIA)    Difficulty of Paying Living Expenses: Not very hard  Food Insecurity: No Food Insecurity (09/21/2024)   Epic    Worried About Radiation Protection Practitioner of Food in the Last Year: Never true    Ran Out of Food in the Last Year: Never true  Transportation Needs: No Transportation Needs (09/21/2024)   Epic    Lack of Transportation (Medical): No    Lack of Transportation (Non-Medical): No  Physical Activity: Sufficiently Active (09/21/2024)   Exercise Vital Sign    Days of Exercise per Week: 7 days    Minutes of Exercise per Session: 150+ min  Stress: No Stress Concern Present (09/21/2024)   [name] - Occupational Stress Questionnaire    Feeling of Stress: Only a little  Social Connections: Moderately Isolated (09/21/2024)   Social Connection and Isolation Panel    Frequency of Communication with Friends and Family: Twice a week    Frequency of Social Gatherings with Friends  and Family: Three times a week    Attends Religious Services: More than 4 times per year    Active Member of Clubs or Organizations: No    Attends Banker Meetings: Not on file    Marital Status: Never married  Intimate Partner Violence: Not At Risk (03/21/2023)   Humiliation, Afraid, Rape, and Kick questionnaire    Fear of Current or Ex-Partner: No    Emotionally Abused: No    Physically Abused: No    Sexually Abused: No  Depression (PHQ2-9): Low Risk (03/26/2024)   Depression (PHQ2-9)    PHQ-2 Score: 0  Alcohol Screen: Low Risk (09/21/2024)   Alcohol Screen    Last Alcohol Screening Score (AUDIT): 1  Housing: Low Risk (09/21/2024)   Epic    Unable to Pay for Housing in the Last Year: No    Number of Times Moved in the Last Year: 0    Homeless in the Last Year: No  Utilities: Low Risk (04/08/2024)   Received  from [facility]   Utilities    In the past 12 months has the electric, gas, oil, or water company threatened to shut off services in your home? : No  Health Literacy: Not on file    Outpatient Medications Prior to Visit  Medication Sig Dispense Refill   Continuous Blood Gluc Sensor (DEXCOM G7 SENSOR) MISC Inject 1 sensor to the skin every 10 days for continuous glucose monitoring.     CONTOUR NEXT TEST test strip SMARTSIG:Via Meter 8 Times Daily     dicyclomine  (BENTYL ) 10 MG capsule TAKE 1 CAPSULE (10 MG TOTAL) BY MOUTH IN THE MORNING AND AT BEDTIME. 180 capsule 1   EPINEPHrine  0.3 mg/0.3 mL IJ SOAJ injection Inject 0.3 mLs (0.3 mg total) into the muscle as needed for anaphylaxis. 2 each 3   Glucagon  (GVOKE HYPOPEN  2-PACK) 0.5 MG/0.1ML SOAJ Inject 1 each into the skin as needed. 0.1 mL 2   Insulin  Disposable Pump (OMNIPOD DASH 5 PACK PODS) MISC USE TO ADMINISTER INSULIN . CHANGE EVERY 72 HOURS     Multiple Vitamin (MULTIVITAMIN) tablet Take 1 tablet by mouth daily.     NOVOLOG 100 UNIT/ML injection SMARTSIG:0-60 Unit(s) SUB-Q Daily     QUEtiapine  (SEROQUEL ) 50 MG tablet Take 1 tablet (50 mg total) by mouth daily. 90 tablet 1   rosuvastatin (CRESTOR) 5 MG tablet Take 5 mg by mouth daily.     No facility-administered medications prior to visit.    No Known Allergies CONSTITUTIONAL: Negative for chills, fatigue, fever,  CARDIOVASCULAR: Negative for chest pain, dizziness, palpitations and pedal edema.  RESPIRATORY: Negative for recent cough and dyspnea.  GASTROINTESTINAL: Negative for abdominal pain, acid reflux symptoms, constipation, diarrhea, nausea and vomiting.  INTEGUMENTARY: Negative for rash.  PSYCHIATRIC: Negative for sleep disturbance and to question depression screen.  Negative for depression, negative for anhedonia.         Objective:  PHYSICAL EXAM:   VS: BP 110/60   Pulse 100   Temp 97.6 F (36.4 C)   Resp 14   Ht 5' 7 (1.702 m)   Wt 157 lb (71.2 kg)   LMP  09/08/2024 (Approximate)   SpO2 98%   BMI 24.59 kg/m   GEN: Well nourished, well developed, in no acute distress  Cardiac: RRR; no murmurs, rubs, or gallops,no edema - Respiratory:  normal respiratory rate and pattern with no distress - normal breath sounds with no rales, rhonchi, wheezes or rubs MS: no deformity or atrophy  Skin: warm and dry, no rash  Neuro:  Alert and Oriented x 3,- CN II-Xii grossly intact Psych: euthymic mood, appropriate affect and demeanor  Health Maintenance Due  Topic Date Due   HEMOGLOBIN A1C  05/05/2024   Diabetic kidney evaluation - eGFR measurement  09/25/2024   Diabetic kidney evaluation - Urine ACR  09/25/2024    There are no preventive care reminders to display for this patient.    Lab Results  Component Value Date   TSH 3.780 09/26/2023   Lab Results  Component Value Date   WBC 7.0 09/26/2023   HGB 13.4 09/26/2023   HCT 40.6 09/26/2023   MCV 94 09/26/2023   PLT 334 09/26/2023   Lab Results  Component Value Date   NA 140 09/26/2023   K 5.3 (H) 09/26/2023   CO2 22 09/26/2023   GLUCOSE 87 09/26/2023   BUN 13 09/26/2023   CREATININE 0.86 09/26/2023   BILITOT 0.3 09/26/2023   ALKPHOS 40 (L) 09/26/2023   AST 17 09/26/2023   ALT 12 09/26/2023   PROT 6.9 09/26/2023   ALBUMIN 4.3 09/26/2023   CALCIUM 9.9 09/26/2023   EGFR 86 09/26/2023   Lab Results  Component Value Date   CHOL 211 (H) 09/26/2023   Lab Results  Component Value Date   HDL 64 09/26/2023   Lab Results  Component Value Date   LDLCALC 126 (H) 09/26/2023   Lab Results  Component Value Date   TRIG 119 09/26/2023   Lab Results  Component Value Date   CHOLHDL 3.3 09/26/2023   Lab Results  Component Value Date   HGBA1C 5.3 11/06/2023       Assessment & Plan:  1. Type 1 diabetes mellitus without complication (HCC) - CBC with Differential/Platelet - Comprehensive metabolic panel - Lipid panel - hgb A1c - urine microalbumin Continue insulin  as  directed Continue follow up with endocrine 2. Primary insomnia - QUEtiapine  (SEROQUEL ) 50 MG tablet; 1 po qd  Dispense: 90 tablet; Refill: 1  3. Hyperlipidemia Continue crestor Watch diet  No orders of the defined types were placed in this encounter.   Orders Placed This Encounter  Procedures   CBC with Differential/Platelet   Comprehensive metabolic panel with GFR   TSH   Lipid panel   Hemoglobin A1c   Microalbumin/Creatinine Ratio, Urine       Follow-up: Return in about 6 months (around 03/25/2025) for complete physical with pap .  An After Visit Summary was printed and given to the patient.  [facility] [phone]

## 2024-09-25 ENCOUNTER — Ambulatory Visit: Payer: Self-pay | Admitting: Physician Assistant

## 2024-09-25 LAB — CBC WITH DIFFERENTIAL/PLATELET
Basophils Absolute: 0 x10E3/uL (ref 0.0–0.2)
Basos: 0 %
EOS (ABSOLUTE): 0.1 x10E3/uL (ref 0.0–0.4)
Eos: 1 %
Hematocrit: 43.4 % (ref 34.0–46.6)
Hemoglobin: 14.3 g/dL (ref 11.1–15.9)
Immature Grans (Abs): 0 x10E3/uL (ref 0.0–0.1)
Immature Granulocytes: 0 %
Lymphocytes Absolute: 1.7 x10E3/uL (ref 0.7–3.1)
Lymphs: 29 %
MCH: 31 pg (ref 26.6–33.0)
MCHC: 32.9 g/dL (ref 31.5–35.7)
MCV: 94 fL (ref 79–97)
Monocytes Absolute: 0.4 x10E3/uL (ref 0.1–0.9)
Monocytes: 7 %
Neutrophils Absolute: 3.8 x10E3/uL (ref 1.4–7.0)
Neutrophils: 63 %
Platelets: 329 x10E3/uL (ref 150–450)
RBC: 4.61 x10E6/uL (ref 3.77–5.28)
RDW: 11.4 % — ABNORMAL LOW (ref 11.7–15.4)
WBC: 6 x10E3/uL (ref 3.4–10.8)

## 2024-09-25 LAB — MICROALBUMIN / CREATININE URINE RATIO
Creatinine, Urine: 196.6 mg/dL
Microalb/Creat Ratio: 4 mg/g{creat} (ref 0–29)
Microalbumin, Urine: 7.3 ug/mL

## 2024-09-25 LAB — LIPID PANEL
Chol/HDL Ratio: 2.6 ratio (ref 0.0–4.4)
Cholesterol, Total: 202 mg/dL — ABNORMAL HIGH (ref 100–199)
HDL: 78 mg/dL (ref >39)
LDL Chol Calc (NIH): 111 mg/dL — ABNORMAL HIGH (ref 0–99)
Triglycerides: 70 mg/dL (ref 0–149)
VLDL Cholesterol Cal: 13 mg/dL (ref 5–40)

## 2024-09-25 LAB — COMPREHENSIVE METABOLIC PANEL WITH GFR
ALT: 18 IU/L (ref 0–32)
AST: 20 IU/L (ref 0–40)
Albumin: 4.7 g/dL (ref 3.9–4.9)
Alkaline Phosphatase: 44 IU/L (ref 41–116)
BUN/Creatinine Ratio: 14 (ref 9–23)
BUN: 12 mg/dL (ref 6–24)
Bilirubin Total: 0.8 mg/dL (ref 0.0–1.2)
CO2: 22 mmol/L (ref 20–29)
Calcium: 9.8 mg/dL (ref 8.7–10.2)
Chloride: 102 mmol/L (ref 96–106)
Creatinine, Ser: 0.88 mg/dL (ref 0.57–1.00)
Globulin, Total: 2.6 g/dL (ref 1.5–4.5)
Glucose: 138 mg/dL — ABNORMAL HIGH (ref 70–99)
Potassium: 5 mmol/L (ref 3.5–5.2)
Sodium: 138 mmol/L (ref 134–144)
Total Protein: 7.3 g/dL (ref 6.0–8.5)
eGFR: 83 mL/min/1.73 (ref >59)

## 2024-09-25 LAB — TSH: TSH: 3.86 u[IU]/mL (ref 0.450–4.500)

## 2024-09-25 LAB — HEMOGLOBIN A1C
Est. average glucose Bld gHb Est-mCnc: 111 mg/dL
Hgb A1c MFr Bld: 5.5 % (ref 4.8–5.6)
